# Patient Record
Sex: Female | Born: 1965 | Race: White | Hispanic: No | Smoking: Current every day smoker
Health system: Southern US, Community
[De-identification: ages and names within clinical notes are randomized; demographics above are authoritative.]

## PROBLEM LIST (undated history)

## (undated) DIAGNOSIS — Z87442 Personal history of urinary calculi: Secondary | ICD-10-CM

## (undated) DIAGNOSIS — M545 Low back pain, unspecified: Secondary | ICD-10-CM

## (undated) DIAGNOSIS — A630 Anogenital (venereal) warts: Secondary | ICD-10-CM

## (undated) DIAGNOSIS — N901 Moderate vulvar dysplasia: Secondary | ICD-10-CM

## (undated) DIAGNOSIS — E669 Obesity, unspecified: Secondary | ICD-10-CM

## (undated) DIAGNOSIS — M1811 Unilateral primary osteoarthritis of first carpometacarpal joint, right hand: Secondary | ICD-10-CM

## (undated) DIAGNOSIS — G43909 Migraine, unspecified, not intractable, without status migrainosus: Secondary | ICD-10-CM

## (undated) DIAGNOSIS — F419 Anxiety disorder, unspecified: Secondary | ICD-10-CM

## (undated) DIAGNOSIS — N879 Dysplasia of cervix uteri, unspecified: Secondary | ICD-10-CM

## (undated) DIAGNOSIS — M722 Plantar fascial fibromatosis: Secondary | ICD-10-CM

## (undated) DIAGNOSIS — Z9889 Other specified postprocedural states: Secondary | ICD-10-CM

## (undated) DIAGNOSIS — J449 Chronic obstructive pulmonary disease, unspecified: Secondary | ICD-10-CM

## (undated) DIAGNOSIS — F329 Major depressive disorder, single episode, unspecified: Secondary | ICD-10-CM

## (undated) DIAGNOSIS — O24419 Gestational diabetes mellitus in pregnancy, unspecified control: Secondary | ICD-10-CM

## (undated) DIAGNOSIS — R112 Nausea with vomiting, unspecified: Secondary | ICD-10-CM

## (undated) DIAGNOSIS — N6001 Solitary cyst of right breast: Secondary | ICD-10-CM

## (undated) DIAGNOSIS — G5603 Carpal tunnel syndrome, bilateral upper limbs: Secondary | ICD-10-CM

## (undated) DIAGNOSIS — K219 Gastro-esophageal reflux disease without esophagitis: Secondary | ICD-10-CM

## (undated) DIAGNOSIS — B009 Herpesviral infection, unspecified: Secondary | ICD-10-CM

## (undated) DIAGNOSIS — F1920 Other psychoactive substance dependence, uncomplicated: Secondary | ICD-10-CM

## (undated) DIAGNOSIS — I10 Essential (primary) hypertension: Secondary | ICD-10-CM

## (undated) HISTORY — PX: MYRINGOTOMY: SUR874

## (undated) HISTORY — PX: WISDOM TOOTH EXTRACTION: SHX21

## (undated) HISTORY — DX: Dysplasia of cervix uteri, unspecified: N87.9

## (undated) HISTORY — PX: COLONOSCOPY: SHX174

## (undated) HISTORY — PX: LASER ABLATION OF THE CERVIX: SHX1949

## (undated) HISTORY — PX: ABDOMINAL HYSTERECTOMY: SHX81

## (undated) HISTORY — PX: CARPAL TUNNEL RELEASE: SHX101

## (undated) HISTORY — PX: TUBAL LIGATION: SHX77

## (undated) HISTORY — PX: TONSILLECTOMY AND ADENOIDECTOMY: SUR1326

---

## 1994-01-14 HISTORY — PX: CERVICAL CONE BIOPSY: SUR198

## 1997-07-11 ENCOUNTER — Other Ambulatory Visit: Admission: RE | Admit: 1997-07-11 | Discharge: 1997-07-11 | Payer: Self-pay | Admitting: Family Medicine

## 1997-08-08 ENCOUNTER — Other Ambulatory Visit: Admission: RE | Admit: 1997-08-08 | Discharge: 1997-08-08 | Payer: Self-pay | Admitting: Family Medicine

## 1998-10-10 ENCOUNTER — Encounter: Payer: Self-pay | Admitting: Emergency Medicine

## 1998-10-10 ENCOUNTER — Emergency Department (HOSPITAL_COMMUNITY): Admission: EM | Admit: 1998-10-10 | Discharge: 1998-10-10 | Payer: Self-pay | Admitting: Emergency Medicine

## 1998-12-21 ENCOUNTER — Other Ambulatory Visit: Admission: RE | Admit: 1998-12-21 | Discharge: 1998-12-21 | Payer: Self-pay | Admitting: Family Medicine

## 1999-12-13 ENCOUNTER — Other Ambulatory Visit: Admission: RE | Admit: 1999-12-13 | Discharge: 1999-12-13 | Payer: Self-pay | Admitting: Ophthalmology

## 2000-10-01 ENCOUNTER — Other Ambulatory Visit: Admission: RE | Admit: 2000-10-01 | Discharge: 2000-10-01 | Payer: Self-pay | Admitting: Family Medicine

## 2002-04-19 ENCOUNTER — Emergency Department (HOSPITAL_COMMUNITY): Admission: EM | Admit: 2002-04-19 | Discharge: 2002-04-19 | Payer: Self-pay | Admitting: Emergency Medicine

## 2005-12-06 ENCOUNTER — Emergency Department (HOSPITAL_COMMUNITY): Admission: EM | Admit: 2005-12-06 | Discharge: 2005-12-06 | Payer: Self-pay | Admitting: Family Medicine

## 2006-06-30 ENCOUNTER — Emergency Department (HOSPITAL_COMMUNITY): Admission: EM | Admit: 2006-06-30 | Discharge: 2006-06-30 | Payer: Self-pay | Admitting: Emergency Medicine

## 2006-07-11 ENCOUNTER — Emergency Department (HOSPITAL_COMMUNITY): Admission: EM | Admit: 2006-07-11 | Discharge: 2006-07-11 | Payer: Self-pay | Admitting: Emergency Medicine

## 2007-07-15 HISTORY — PX: TOTAL VAGINAL HYSTERECTOMY: SHX2548

## 2007-08-05 ENCOUNTER — Ambulatory Visit (HOSPITAL_COMMUNITY): Admission: RE | Admit: 2007-08-05 | Discharge: 2007-08-06 | Payer: Self-pay | Admitting: Obstetrics and Gynecology

## 2007-08-05 ENCOUNTER — Encounter (INDEPENDENT_AMBULATORY_CARE_PROVIDER_SITE_OTHER): Payer: Self-pay | Admitting: Obstetrics and Gynecology

## 2007-12-09 ENCOUNTER — Encounter: Admission: RE | Admit: 2007-12-09 | Discharge: 2007-12-09 | Payer: Self-pay | Admitting: Family Medicine

## 2007-12-18 ENCOUNTER — Encounter: Admission: RE | Admit: 2007-12-18 | Discharge: 2007-12-18 | Payer: Self-pay | Admitting: Family Medicine

## 2007-12-18 DIAGNOSIS — N6001 Solitary cyst of right breast: Secondary | ICD-10-CM

## 2007-12-18 HISTORY — DX: Solitary cyst of right breast: N60.01

## 2010-03-12 DIAGNOSIS — E669 Obesity, unspecified: Secondary | ICD-10-CM | POA: Insufficient documentation

## 2010-05-04 ENCOUNTER — Emergency Department (HOSPITAL_COMMUNITY)
Admission: EM | Admit: 2010-05-04 | Discharge: 2010-05-04 | Disposition: A | Discharge status: Home / Self Care | Payer: Medicaid Other | Attending: Emergency Medicine | Admitting: Emergency Medicine

## 2010-05-04 DIAGNOSIS — H6690 Otitis media, unspecified, unspecified ear: Secondary | ICD-10-CM | POA: Insufficient documentation

## 2010-05-04 DIAGNOSIS — K219 Gastro-esophageal reflux disease without esophagitis: Secondary | ICD-10-CM | POA: Insufficient documentation

## 2010-05-04 DIAGNOSIS — J3489 Other specified disorders of nose and nasal sinuses: Secondary | ICD-10-CM | POA: Insufficient documentation

## 2010-05-04 DIAGNOSIS — H9209 Otalgia, unspecified ear: Secondary | ICD-10-CM | POA: Insufficient documentation

## 2010-05-04 LAB — RAPID STREP SCREEN (MED CTR MEBANE ONLY): Streptococcus, Group A Screen (Direct): NEGATIVE

## 2010-05-29 NOTE — H&P (Signed)
[name], [name]                 ACCOUNT NO.:  [id number]   MEDICAL RECORD NO.:  [id number]          PATIENT TYPE:  OUT   LOCATION:  SDC                           FACILITY:  [facility]   PHYSICIAN:  Zenaida Niece, M.D.DATE OF BIRTH:  1965/03/09   DATE OF ADMISSION:  DATE OF DISCHARGE:                              HISTORY & PHYSICAL   CHIEF COMPLAINT:  Is menorrhagia and dysmenorrhea.   HISTORY OF PRESENT ILLNESS:  This is a 45 year old female gravida 4,  para 2-0-2-2 whom I saw for the first time in May of this year.  At that  time she was having regular periods every month.  She says that she is  miserable during her periods, in bed for 1-2 days with cramps and heavy  bleeding with clots.  She has a history of bacterial vaginosis and does  complain of odor after her periods and with intercourse.  She also has  occasional pain with intercourse in her lower abdomen. Physical exam  revealed a normal-sized uterus and no significant masses.  Office  hemoglobin and urine were normal.  Her Affirm was positive for bacterial  vaginosis and she was treated with oral clindamycin.  All nonsurgical  and surgical options for her symptoms were discussed and the patient  wishes to proceed with definitive surgical therapy and is being admitted  for vaginal hysterectomy.   PAST OB HISTORY:  Two vaginal deliveries at term and 2 elective  terminations.   PAST MEDICAL HISTORY:  Negative.  No venous thromboembolic disease.   SURGICAL HISTORY:  1. Bilateral tubal ligation.  2. Tonsillectomy.  3. Laser cone biopsy.   ALLERGIES:  None known.   CURRENT MEDICATIONS:  None.   GYN HISTORY:  History of CIN with laser cone.  No recent Pap smears  until the one done here in May which was normal, also with a negative  gonorrhea and chlamydia.  She does have a history of gonorrhea,  chlamydia and Trichomonas.   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  She used  to use crack cocaine.   In May she reported that she had been clean for  approximately 120 days.  She does smoke a half-pack of cigarettes a day.   REVIEW OF SYSTEMS:  She does have some urinary frequency and some stress  incontinence but these are not and issue and normal bowel movements.   FAMILY HISTORY:  Paternal grandmother with breast cancer.  Paternal  great aunt with some sort of pelvic cancer.   PHYSICAL EXAM:  Generally, this is a well-developed white female in no  acute distress.  Weight is 180 pounds.  Blood pressure was 110/74.  NECK:  Supple without lymphadenopathy or thyromegaly.  LUNGS:  Clear to auscultation.  HEART:  Regular rate and rhythm without murmur.  ABDOMEN:  Soft, nontender, nondistended without a palpable mass.  EXTREMITIES:  Have no edema and are nontender.  PELVIC EXAM:  External genitalia was normal with 2 small furuncles.  Speculum exam revealed a normal cervix.  On bimanual exam showed a small  anteverted  to midplanar nontender uterus and no adnexal masses and this  is confirmed by rectovaginal exam.   ASSESSMENT:  Is menorrhagia and dysmenorrhea that is essentially  debilitating for 1-2 days a month.  All nonsurgical and surgical options  have been discussed with the patient.  The patient wishes to proceed  with definitive surgical therapy with hysterectomy.  All surgical routes  and all risks of surgery have been discussed.  Plan is to admit the  patient on the day of surgery for a vaginal hysterectomy and cystoscopy.  She has been taking Septra for a preoperative urinary tract infection.      Zenaida Niece, M.D.  Electronically Signed     TDM/MEDQ  D:  08/04/2007  T:  08/04/2007  Job:  [id number]

## 2010-05-29 NOTE — Op Note (Signed)
[name], AXE                 ACCOUNT NO.:  [id number]   MEDICAL RECORD NO.:  [id number]          PATIENT TYPE:  OIB   LOCATION:  9309                          FACILITY:  [facility]   PHYSICIAN:  Zenaida Niece, M.D.DATE OF BIRTH:  03-22-1965   DATE OF PROCEDURE:  08/05/2007  DATE OF DISCHARGE:                               OPERATIVE REPORT   PREOPERATIVE DIAGNOSES:  Menorrhagia and dysmenorrhea.   POSTOPERATIVE DIAGNOSES:  Menorrhagia and dysmenorrhea.   PROCEDURES:  Total vaginal hysterectomy and cystoscopy.   SURGEON:  Zenaida Niece, MD   ASSISTANT:  Sherron Monday, MD   ANESTHESIA:  Spinal.   FINDINGS:  She had normal uterus and ovaries.  Bladder with cystoscopy  was normal with patent ureters.   SPECIMENS:  Uterus with cervix sent to pathology.   ESTIMATED BLOOD LOSS:  100 mL.   COMPLICATIONS:  None.   PROCEDURE IN DETAIL:  The patient was taken to the operating room and  placed in the sitting position.  Dr. Malen Gauze instilled spinal anesthesia.  She was then placed in the dorsal supine position.  Legs were then  elevated in mobile stirrups.  Perineum and vagina were then prepped and  draped in the usual sterile fashion and bladder drained with a latex-  free catheter.  A weighted spectrum was placed into the vagina and  Deaver  retractors were used anteriorly and laterally.  Cervix was  grasped with Christella Hartigan tenaculums.  The cervical vaginal mucosa was  infiltrated with a dilute solution of Pitressin and then incised  circumferentially with electrocautery.  Sharp dissection was then used  to further free the vagina from the cervix.  The anterior peritoneum was  pushed off the anterior portion of the cervix.  This was identified and  entered sharply and a Deaver retractor was used to retract the bladder  anterior.  The posterior cul-de-sac was identified and entered sharply.  A Bonnano speculum was then placed into the posterior cul-de-sac.  Uterosacral ligaments were  clamped, transected and ligated on each side  with #1 chromic.  Cardinal ligaments and uterine artery were clamped,  transected and ligated with #1 chromic.  Broad ligaments were then  clamped, transected and ligated with #1 chromic.  The posterior uterus  was then delivered through the incision and the utero-ovarian pedicles  were clamped and transected and the uterus was removed.  The utero-  ovarian pedicles were doubly ligated with #1 chromic.  Tubes and ovaries  were normal.  All pedicles were inspected.  A small amount bleeding on  the right side was controlled with electrocautery.  The uterosacral  ligaments were then plicated in the midline with 2-0 silk.  The  previously tied uterosacral pedicles were also tied in the midline.  The  vaginal cuff was then closed in a vertical fashion with running, locking  2-0 Vicryl with adequate closure and adequate hemostasis.   Attention was turned to cystoscopy.  The patient had been given indigo  carmine IV.  A 70-degree cystoscope was inserted and good visualization  was achieved.  The bladder appeared normal.  Indigo carmine was seen to  flow freely from each ureteral orifice.  The cystoscope was removed and  a latex-free Foley catheter was placed.  The patient was then taken down  from stirrups.  She was taken to the recovery room in stable condition  after tolerating the procedure well.  Counts were correct x2, she  received Ancef 1 gram IV prior to the procedure and had PAS hose on  throughout the procedure.      Zenaida Niece, M.D.  Electronically Signed     TDM/MEDQ  D:  08/05/2007  T:  08/06/2007  Job:  [id number]

## 2010-10-12 LAB — CBC
HCT: 36.8
HCT: 41.8
Hemoglobin: 12.4
Hemoglobin: 14.3
MCHC: 33.6
MCV: 95
RBC: 3.87
RBC: 4.51
RDW: 13.4

## 2010-11-19 DIAGNOSIS — J42 Unspecified chronic bronchitis: Secondary | ICD-10-CM | POA: Insufficient documentation

## 2011-01-23 ENCOUNTER — Ambulatory Visit (HOSPITAL_COMMUNITY): Payer: Medicaid Other | Admitting: Psychiatry

## 2011-03-22 ENCOUNTER — Ambulatory Visit (HOSPITAL_COMMUNITY): Payer: Medicaid Other | Admitting: Psychiatry

## 2017-06-24 DIAGNOSIS — M1811 Unilateral primary osteoarthritis of first carpometacarpal joint, right hand: Secondary | ICD-10-CM | POA: Insufficient documentation

## 2017-06-24 DIAGNOSIS — G5603 Carpal tunnel syndrome, bilateral upper limbs: Secondary | ICD-10-CM | POA: Insufficient documentation

## 2017-09-11 NOTE — Progress Notes (Signed)
[place] at [facility] Note: New Patient FIRST VISIT   Consult was requested by Dr. Thornell Sartorius for vulvar dysplasia incompletely resected   Chief Complaint  Patient presents with  . VIN II (vulvar intraepithelial neoplasia II)    ONCOLOGIC SUMMARY 1. N/A   HPI: [name]  is a very nice 52 y.o.  P2  The patient was seen for a vulvar lesion by Derry Lory, FNP in Dr. Roe Rutherford office. There was a lesion seen that was excised by Dr. Melba Coon. 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 Melba Coon referred the patient to see if we recommend reexcision of the surgical site.  The patient had a hard time at work after the procedure, missing almost 2 weeks and then getting reprimanded from administration about her time taken. She voices concern about having to face another procedure with these constraints.  Notable is that Dr. Melba Coon performed vaginal hysterectomy here at [facility] 07/2007 for menorrhagia/dysmenorrhea. Pathology was benign, no mention of dysplasia in cervix.   Imported EPIC Oncologic History:   No history exists.    ECOG PERFORMANCE STATUS: 1 - Symptomatic but completely ambulatory  Measurement of disease: N/A .   Radiology: . Nothing relevant to referral  Outpatient Encounter Medications as of 09/12/2017  Medication Sig  . ibuprofen (ADVIL,MOTRIN) 200 MG tablet Take 200 mg by mouth as needed.   No facility-administered encounter medications on file as of 09/12/2017.    No Known Allergies  Past Medical History:  Diagnosis Date  . Cervical dysplasia    Past Surgical History:  Procedure Laterality Date  . CERVICAL CONE BIOPSY  1996   Laser  . TONSILLECTOMY AND ADENOIDECTOMY    . TOTAL VAGINAL HYSTERECTOMY  2009   TVH-menorrhagia, dysmenorrhea  . TUBAL LIGATION     Tubal Ligation/Steriization        Past Gynecological History:   GYNECOLOGIC HISTORY:  . No LMP recorded. 52 yo with vag hyst . Menarche: 52  years old . P 2 . ContraceptiveOCP in past . HRT none  . Last Pap 05/2007 negative on record. N/A since patient had hysterectomy for non-dysplasia diagnosis Family Hx:  Family History  Problem Relation Age of Onset  . Heart failure Mother   . Diabetes Mother   . Breast cancer Paternal Grandmother   . Tuberculosis Paternal Grandmother   . COPD Maternal Grandmother   . Emphysema Maternal Grandmother   . Lung cancer Maternal Grandfather   . Heart failure Paternal Grandfather    Social Hx:  Marland Kitchen Tobacco use: current . Alcohol use: none . Illicit Drug use: none . Illicit IV Drug use: none    Review of Systems: Review of Systems  Skin: Positive for wound.  All other systems reviewed and are negative. + early satiety  Vitals: There were no vitals taken for this visit. Vitals:   09/12/17 1400  Weight: 176 lb 8 oz (80.1 kg)  Height: 5\' 4"  (1.626 m)    Vitals:   09/12/17 1400  BP: 129/86  Pulse: 70  Resp: 20  Temp: 98.7 F (37.1 C)  SpO2: 98%   Body mass index is 30.3 kg/m.   Physical Exam: General :  Well developed, 52 y.o., female in no apparent distress HEENT:  Normocephalic/atraumatic, symmetric, EOMI, eyelids normal Neck:   Supple, no masses.  Lymphatics:  No cervical/ submandibular/ supraclavicular/ infraclavicular/ inguinal adenopathy Respiratory:  Respirations unlabored, no use of accessory muscles CV:   Deferred Breast:  Deferred Musculoskeletal: No CVA tenderness, normal muscle strength. Abdomen:  Soft, non-tender and nondistended. No evidence of hernia. No masses. Extremities:  No lymphedema, no erythema, non-tender. Skin:   Normal inspection Neuro/Psych:  No focal motor deficit, no abnormal mental status. Normal gait. Normal affect. Alert and oriented to person, place, and time  Genito Urinary: Vulva: 3-4:00 (left vulva) healing from office excision. There is visible AWL still on the superior/medial border. I did not use vinegar due to the healing tissue and  concern for pain Bladder/urethra: Urethral meatus normal in size and location. 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 Data reviewed ? There are no radiology reports relevant to her referral to review ? We did review the pathology report relevant to why she is being seen today (that being VIN 1-2) ? I reviewed her referring doctor's office notes and I have summarized in the HPI ? History was obtained from the patient and partly from the chart 2. VIN 1-2 ? Typically we can observe if surgical margins are grossly cleared, or if simply VIN1. ? However there is a lesion remaining at the border of the excision site and the pathology discusses VIN2 ? I recommend therefore that we move to reexcise the area 3. We discussed partial vulvectomy R/B/A and being out on FMLA about 3 weeks with reassessment at 2 weeks to see if that plan will work 4. We discussed smoking cessation today and I offered ideas for quitting. o I specifically explained that HPV thrives in a nicotine laden environment and encouraged her to stop or she risks dysplasia in the vagina and the vulva given this is likely HPV related.  Face to face time with patient was 45 minutes. Over 50% of this time was spent on counseling and coordination of care.  Isabel Caprice, MD  09/12/2017, 6:09 PM    Cc: Janyth Contes, MD (Referring Ob/Gyn) N/A patient denies (PCP)

## 2017-09-11 NOTE — H&P (View-Only) (Signed)
[place] at [facility] Note: New Patient FIRST VISIT   Consult was requested by Dr. Thornell Sartorius for vulvar dysplasia incompletely resected   Chief Complaint  Patient presents with  . VIN II (vulvar intraepithelial neoplasia II)    ONCOLOGIC SUMMARY 1. N/A   HPI: [name]  is a very nice 52 y.o.  P2  The patient was seen for a vulvar lesion by Derry Lory, FNP in Dr. Roe Rutherford office. There was a lesion seen that was excised by Dr. Melba Coon. 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 Melba Coon referred the patient to see if we recommend reexcision of the surgical site.  The patient had a hard time at work after the procedure, missing almost 2 weeks and then getting reprimanded from administration about her time taken. She voices concern about having to face another procedure with these constraints.  Notable is that Dr. Melba Coon performed vaginal hysterectomy here at [facility] 07/2007 for menorrhagia/dysmenorrhea. Pathology was benign, no mention of dysplasia in cervix.   Imported EPIC Oncologic History:   No history exists.    ECOG PERFORMANCE STATUS: 1 - Symptomatic but completely ambulatory  Measurement of disease: N/A .   Radiology: . Nothing relevant to referral  Outpatient Encounter Medications as of 09/12/2017  Medication Sig  . ibuprofen (ADVIL,MOTRIN) 200 MG tablet Take 200 mg by mouth as needed.   No facility-administered encounter medications on file as of 09/12/2017.    No Known Allergies  Past Medical History:  Diagnosis Date  . Cervical dysplasia    Past Surgical History:  Procedure Laterality Date  . CERVICAL CONE BIOPSY  1996   Laser  . TONSILLECTOMY AND ADENOIDECTOMY    . TOTAL VAGINAL HYSTERECTOMY  2009   TVH-menorrhagia, dysmenorrhea  . TUBAL LIGATION     Tubal Ligation/Steriization        Past Gynecological History:   GYNECOLOGIC HISTORY:  . No LMP recorded. 52 yo with vag hyst . Menarche: 52  years old . P 2 . ContraceptiveOCP in past . HRT none  . Last Pap 05/2007 negative on record. N/A since patient had hysterectomy for non-dysplasia diagnosis Family Hx:  Family History  Problem Relation Age of Onset  . Heart failure Mother   . Diabetes Mother   . Breast cancer Paternal Grandmother   . Tuberculosis Paternal Grandmother   . COPD Maternal Grandmother   . Emphysema Maternal Grandmother   . Lung cancer Maternal Grandfather   . Heart failure Paternal Grandfather    Social Hx:  Marland Kitchen Tobacco use: current . Alcohol use: none . Illicit Drug use: none . Illicit IV Drug use: none    Review of Systems: Review of Systems  Skin: Positive for wound.  All other systems reviewed and are negative. + early satiety  Vitals: There were no vitals taken for this visit. Vitals:   09/12/17 1400  Weight: 176 lb 8 oz (80.1 kg)  Height: 5\' 4"  (1.626 m)    Vitals:   09/12/17 1400  BP: 129/86  Pulse: 70  Resp: 20  Temp: 98.7 F (37.1 C)  SpO2: 98%   Body mass index is 30.3 kg/m.   Physical Exam: General :  Well developed, 52 y.o., female in no apparent distress HEENT:  Normocephalic/atraumatic, symmetric, EOMI, eyelids normal Neck:   Supple, no masses.  Lymphatics:  No cervical/ submandibular/ supraclavicular/ infraclavicular/ inguinal adenopathy Respiratory:  Respirations unlabored, no use of accessory muscles CV:   Deferred Breast:  Deferred Musculoskeletal: No CVA tenderness, normal muscle strength. Abdomen:  Soft, non-tender and nondistended. No evidence of hernia. No masses. Extremities:  No lymphedema, no erythema, non-tender. Skin:   Normal inspection Neuro/Psych:  No focal motor deficit, no abnormal mental status. Normal gait. Normal affect. Alert and oriented to person, place, and time  Genito Urinary: Vulva: 3-4:00 (left vulva) healing from office excision. There is visible AWL still on the superior/medial border. I did not use vinegar due to the healing tissue and  concern for pain Bladder/urethra: Urethral meatus normal in size and location. 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 Data reviewed ? There are no radiology reports relevant to her referral to review ? We did review the pathology report relevant to why she is being seen today (that being VIN 1-2) ? I reviewed her referring doctor's office notes and I have summarized in the HPI ? History was obtained from the patient and partly from the chart 2. 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 Typically we can observe if surgical margins are grossly cleared, or if simply VIN1. ? However there is a lesion remaining at the border of the excision site and the pathology discusses VIN2 ? I recommend therefore that we move to reexcise the area 3. We discussed partial vulvectomy R/B/A and being out on FMLA about 3 weeks with reassessment at 2 weeks to see if that plan will work 4. We discussed smoking cessation today and I offered ideas for quitting. o I specifically explained that HPV thrives in a nicotine laden environment and encouraged her to stop or she risks dysplasia in the vagina and the vulva given this is likely HPV related.  Face to face time with patient was 45 minutes. Over 50% of this time was spent on counseling and coordination of care.  Isabel Caprice, MD  09/12/2017, 6:09 PM    Cc: Janyth Contes, MD (Referring Ob/Gyn) N/A patient denies (PCP)

## 2017-09-12 ENCOUNTER — Encounter: Payer: Self-pay | Admitting: Gynecologic Oncology

## 2017-09-12 ENCOUNTER — Encounter: Payer: Self-pay | Admitting: *Deleted

## 2017-09-12 ENCOUNTER — Inpatient Hospital Stay: Payer: BLUE CROSS/BLUE SHIELD | Attending: Obstetrics | Admitting: Obstetrics

## 2017-09-12 VITALS — BP 129/86 | HR 70 | Temp 98.7°F | Resp 20 | Ht 64.0 in | Wt 176.5 lb

## 2017-09-12 DIAGNOSIS — F17210 Nicotine dependence, cigarettes, uncomplicated: Secondary | ICD-10-CM

## 2017-09-12 DIAGNOSIS — N901 Moderate vulvar dysplasia: Secondary | ICD-10-CM | POA: Insufficient documentation

## 2017-09-12 DIAGNOSIS — Z90710 Acquired absence of both cervix and uterus: Secondary | ICD-10-CM

## 2017-09-12 NOTE — Patient Instructions (Signed)
Plan to have a partial vulvectomy on September 25, 2017 with Dr. Precious Haws at the [facility].  You will receive a phone call from the pre-surgical RN to discuss instructions.  Please call for any questions or concerns.   Vulvectomy Vulvectomy is a surgical procedure to remove all or part of the outer female genital organs (vulva). The vulva includes the outer and inner lips of the vagina and the clitoris. You may need this surgery if you have a cancerous growth in your vulva. There are two types of vulvectomy:  A simple vulvectomy. This is the removal of the entire vulva.  A radical vulvectomy. A radical vulvectomy can be partial or complete. ? A partial radical vulvectomy is when part of the vulva and surrounding deep tissue is removed. ? A complete radical vulvectomy is when the vulva, clitoris, and surrounding deep tissue is removed.  During a radical vulvectomy, some lymph nodes near the vulva may also be removed. Tell a health care provider about:  Any allergies you have.  All medicines you are taking, including vitamins, herbs, eye drops, creams, and over-the-counter medicines.  Any problems you or family members have had with anesthetic medicines.  Any blood disorders you have.  Any surgeries you have had.  Any medical conditions you have.  Whether you are pregnant or may be pregnant. What are the risks? Generally, this is a safe procedure. However, problems may occur, including:  Infection.  Bleeding.  Allergic reactions to medicines.  Damage to other structures or organs.  Urinary tract infections.  Lymphedema. This is when your legs swell after the removal of lymph nodes from your groin area.  Pain or decreased sexual pleasure when having sex.  Long-term vaginal swelling, tightness, numbness, or pain.  A blood clot that may travel to the lung (pulmonary embolism).  What happens before the procedure?  Follow instructions from  your health care provider about eating or drinking restrictions.  Ask your health care provider about: ? Changing or stopping your regular medicines. This is especially important if you are taking diabetes medicines or blood thinners. ? Taking medicines such as aspirin and ibuprofen. These medicines can thin your blood. Do not take these medicines before your procedure if your health care provider instructs you not to.  Ask your health care provider how your surgical site will be marked or identified.  You may be given antibiotic medicine to help prevent infection.  Plan to have someone take you home after the procedure.  If you will be going home right after the procedure, plan to have someone with you for 24 hours. What happens during the procedure?  To reduce your risk of infection: ? Your health care team will wash or sanitize their hands. ? Your skin will be washed with soap.  An IV tube will be inserted into one of your veins.  You will be given one or more of the following: ? A medicine to help you relax (sedative). ? A medicine to make you fall asleep (general anesthetic). ? A medicine that is injected into your spine to numb the area below and slightly above the injection site (spinal anesthetic).  A tube (catheter) may be inserted through the outer opening of your bladder (urethra) to drain urine during and after surgery.  Depending on the type of vulvectomy you are having, your surgeon will make an incision and remove the affected area. This may include: ? Removing the entire vulva. ? Removing part of  the vulva, surrounding deep tissue, and lymph nodes. ? Removing the vulva, clitoris, surrounding deep tissue, and lymph nodes.  If your lymph nodes are removed, a drain may be placed in the area to help avoid fluid buildup.  Your incisions will be closed and covered with a bandage (dressing). The procedure may vary among health care providers and hospitals. What happens  after the procedure?  Your blood pressure, heart rate, breathing rate, and blood oxygen level will be monitored often until the medicines you were given have worn off.  You will get medicine for pain as needed.  You may get medicine to prevent constipation.  You may be on a liquid diet at first, and then switch to a regular diet.  When you are taking fluids well, your IV will be removed.  If your catheter was left in place after surgery, it will be removed when your health care provider approves.  You will be asked to breathe deeply and to get out of bed and walk as soon as you can.  You may have to wear compression stockings. These stockings help to prevent blood clots and reduce swelling in your legs.  Do not drive for 24 hours if you received a sedative. This information is not intended to replace advice given to you by your health care provider. Make sure you discuss any questions you have with your health care provider. Document Released: 01/27/2015 Document Revised: 06/08/2015 Document Reviewed: 12/26/2014 Elsevier Interactive Patient Education  [employer].

## 2017-09-18 ENCOUNTER — Telehealth: Payer: Self-pay

## 2017-09-18 ENCOUNTER — Encounter (HOSPITAL_BASED_OUTPATIENT_CLINIC_OR_DEPARTMENT_OTHER): Payer: Self-pay

## 2017-09-18 NOTE — Telephone Encounter (Signed)
Received a call from Maudie Mercury at [facility] for Dr Melba Coon and Lesia Hausen NP regarding referral here and recent office notes per Dr Gerarda Fraction.  Per request of Eve NP / Dr Melba Coon- faxed those notes to their fax [phone]. No other needs per office at this time.

## 2017-09-19 ENCOUNTER — Other Ambulatory Visit: Payer: Self-pay

## 2017-09-19 ENCOUNTER — Encounter (HOSPITAL_BASED_OUTPATIENT_CLINIC_OR_DEPARTMENT_OTHER): Payer: Self-pay

## 2017-09-19 NOTE — Progress Notes (Signed)
Spoke with:  Raquel Sarna NPO:  After Midnight, no gum, candy, or mints   Arrival time:0530AM Labs:  N/A AM medications: None Pre op orders: Yes Ride home:  Santiago Glad (mom) [phone]

## 2017-09-25 ENCOUNTER — Ambulatory Visit (HOSPITAL_BASED_OUTPATIENT_CLINIC_OR_DEPARTMENT_OTHER): Payer: BLUE CROSS/BLUE SHIELD | Admitting: Anesthesiology

## 2017-09-25 ENCOUNTER — Encounter (HOSPITAL_BASED_OUTPATIENT_CLINIC_OR_DEPARTMENT_OTHER): Payer: Self-pay | Admitting: *Deleted

## 2017-09-25 ENCOUNTER — Ambulatory Visit (HOSPITAL_BASED_OUTPATIENT_CLINIC_OR_DEPARTMENT_OTHER)
Admission: RE | Admit: 2017-09-25 | Discharge: 2017-09-25 | Disposition: A | Discharge status: Home / Self Care | Payer: BLUE CROSS/BLUE SHIELD | Source: Ambulatory Visit | Attending: Obstetrics | Admitting: Obstetrics

## 2017-09-25 ENCOUNTER — Encounter (HOSPITAL_BASED_OUTPATIENT_CLINIC_OR_DEPARTMENT_OTHER)
Admission: RE | Disposition: A | Discharge status: Home / Self Care | Payer: Self-pay | Source: Ambulatory Visit | Attending: Obstetrics

## 2017-09-25 DIAGNOSIS — N901 Moderate vulvar dysplasia: Secondary | ICD-10-CM | POA: Insufficient documentation

## 2017-09-25 DIAGNOSIS — J449 Chronic obstructive pulmonary disease, unspecified: Secondary | ICD-10-CM | POA: Diagnosis not present

## 2017-09-25 DIAGNOSIS — N904 Leukoplakia of vulva: Secondary | ICD-10-CM | POA: Diagnosis not present

## 2017-09-25 DIAGNOSIS — K219 Gastro-esophageal reflux disease without esophagitis: Secondary | ICD-10-CM | POA: Diagnosis not present

## 2017-09-25 DIAGNOSIS — F17200 Nicotine dependence, unspecified, uncomplicated: Secondary | ICD-10-CM | POA: Insufficient documentation

## 2017-09-25 DIAGNOSIS — Z90710 Acquired absence of both cervix and uterus: Secondary | ICD-10-CM | POA: Insufficient documentation

## 2017-09-25 DIAGNOSIS — N903 Dysplasia of vulva, unspecified: Secondary | ICD-10-CM

## 2017-09-25 HISTORY — DX: Herpesviral infection, unspecified: B00.9

## 2017-09-25 HISTORY — DX: Carpal tunnel syndrome, bilateral upper limbs: G56.03

## 2017-09-25 HISTORY — DX: Solitary cyst of right breast: N60.01

## 2017-09-25 HISTORY — DX: Gastro-esophageal reflux disease without esophagitis: K21.9

## 2017-09-25 HISTORY — DX: Low back pain: M54.5

## 2017-09-25 HISTORY — PX: VULVECTOMY: SHX1086

## 2017-09-25 HISTORY — DX: Chronic obstructive pulmonary disease, unspecified: J44.9

## 2017-09-25 HISTORY — DX: Low back pain, unspecified: M54.50

## 2017-09-25 HISTORY — DX: Gestational diabetes mellitus in pregnancy, unspecified control: O24.419

## 2017-09-25 HISTORY — DX: Anogenital (venereal) warts: A63.0

## 2017-09-25 HISTORY — DX: Other psychoactive substance dependence, uncomplicated: F19.20

## 2017-09-25 HISTORY — DX: Anxiety disorder, unspecified: F41.9

## 2017-09-25 HISTORY — DX: Obesity, unspecified: E66.9

## 2017-09-25 HISTORY — DX: Migraine, unspecified, not intractable, without status migrainosus: G43.909

## 2017-09-25 HISTORY — DX: Moderate vulvar dysplasia: N90.1

## 2017-09-25 HISTORY — DX: Unilateral primary osteoarthritis of first carpometacarpal joint, right hand: M18.11

## 2017-09-25 HISTORY — DX: Plantar fascial fibromatosis: M72.2

## 2017-09-25 HISTORY — DX: Major depressive disorder, single episode, unspecified: F32.9

## 2017-09-25 SURGERY — VULVECTOMY
Anesthesia: General | Site: Vagina

## 2017-09-25 MED ORDER — FENTANYL CITRATE (PF) 100 MCG/2ML IJ SOLN
INTRAMUSCULAR | Status: AC
  Filled 2017-09-25: qty 2

## 2017-09-25 MED ORDER — MIDAZOLAM HCL 2 MG/2ML IJ SOLN
INTRAMUSCULAR | Status: AC
  Filled 2017-09-25: qty 2

## 2017-09-25 MED ORDER — DEXAMETHASONE SODIUM PHOSPHATE 10 MG/ML IJ SOLN
INTRAMUSCULAR | Status: AC
  Filled 2017-09-25: qty 1

## 2017-09-25 MED ORDER — KETOROLAC TROMETHAMINE 30 MG/ML IJ SOLN
30.0000 mg | Freq: Once | INTRAMUSCULAR | Status: DC | PRN
  Filled 2017-09-25: qty 1

## 2017-09-25 MED ORDER — LIDOCAINE 2% (20 MG/ML) 5 ML SYRINGE
INTRAMUSCULAR | Status: AC
  Filled 2017-09-25: qty 5

## 2017-09-25 MED ORDER — ONDANSETRON HCL 4 MG/2ML IJ SOLN
4.0000 mg | Freq: Once | INTRAMUSCULAR | Status: DC | PRN
  Filled 2017-09-25: qty 2

## 2017-09-25 MED ORDER — HYDROMORPHONE HCL 1 MG/ML IJ SOLN
0.2500 mg | INTRAMUSCULAR | Status: DC | PRN
  Filled 2017-09-25: qty 0.5

## 2017-09-25 MED ORDER — LIDOCAINE 2% (20 MG/ML) 5 ML SYRINGE
INTRAMUSCULAR | Status: DC | PRN
  Administered 2017-09-25: 60 mg via INTRAVENOUS

## 2017-09-25 MED ORDER — PROPOFOL 10 MG/ML IV BOLUS
INTRAVENOUS | Status: DC | PRN
  Administered 2017-09-25 (×2): 50 mg via INTRAVENOUS
  Administered 2017-09-25: 200 mg via INTRAVENOUS

## 2017-09-25 MED ORDER — CEFAZOLIN SODIUM-DEXTROSE 2-4 GM/100ML-% IV SOLN
2.0000 g | INTRAVENOUS | Status: AC
  Administered 2017-09-25: 2 g via INTRAVENOUS
  Filled 2017-09-25: qty 100

## 2017-09-25 MED ORDER — FENTANYL CITRATE (PF) 100 MCG/2ML IJ SOLN
25.0000 ug | INTRAMUSCULAR | Status: DC | PRN
  Filled 2017-09-25: qty 1

## 2017-09-25 MED ORDER — LIDOCAINE-EPINEPHRINE 1 %-1:100000 IJ SOLN
INTRAMUSCULAR | Status: DC | PRN
  Administered 2017-09-25: 4 mL

## 2017-09-25 MED ORDER — ACETAMINOPHEN 160 MG/5ML PO SOLN
325.0000 mg | ORAL | Status: DC | PRN
  Filled 2017-09-25: qty 20.3

## 2017-09-25 MED ORDER — ACETIC ACID 5 % SOLN
Status: DC | PRN
  Administered 2017-09-25: 1 via TOPICAL

## 2017-09-25 MED ORDER — KETOROLAC TROMETHAMINE 30 MG/ML IJ SOLN
INTRAMUSCULAR | Status: DC | PRN
  Administered 2017-09-25: 30 mg via INTRAVENOUS

## 2017-09-25 MED ORDER — FENTANYL CITRATE (PF) 100 MCG/2ML IJ SOLN
INTRAMUSCULAR | Status: DC | PRN
  Administered 2017-09-25 (×2): 50 ug via INTRAVENOUS

## 2017-09-25 MED ORDER — ONDANSETRON HCL 4 MG/2ML IJ SOLN
INTRAMUSCULAR | Status: DC | PRN
  Administered 2017-09-25: 4 mg via INTRAVENOUS

## 2017-09-25 MED ORDER — ONDANSETRON HCL 4 MG/2ML IJ SOLN
INTRAMUSCULAR | Status: AC
  Filled 2017-09-25: qty 2

## 2017-09-25 MED ORDER — KETOROLAC TROMETHAMINE 30 MG/ML IJ SOLN
INTRAMUSCULAR | Status: AC
  Filled 2017-09-25: qty 1

## 2017-09-25 MED ORDER — DEXAMETHASONE SODIUM PHOSPHATE 10 MG/ML IJ SOLN
INTRAMUSCULAR | Status: DC | PRN
  Administered 2017-09-25: 10 mg via INTRAVENOUS

## 2017-09-25 MED ORDER — TRAMADOL HCL 50 MG PO TABS: 50.0000 mg | ORAL_TABLET | Freq: Four times a day (QID) | ORAL | 0 refills | Status: AC | PRN

## 2017-09-25 MED ORDER — PROPOFOL 10 MG/ML IV BOLUS
INTRAVENOUS | Status: AC
  Filled 2017-09-25: qty 40

## 2017-09-25 MED ORDER — ACETAMINOPHEN 325 MG PO TABS
325.0000 mg | ORAL_TABLET | ORAL | Status: DC | PRN
  Filled 2017-09-25: qty 2

## 2017-09-25 MED ORDER — ARTIFICIAL TEARS OPHTHALMIC OINT
TOPICAL_OINTMENT | OPHTHALMIC | Status: AC
  Filled 2017-09-25: qty 3.5

## 2017-09-25 MED ORDER — LACTATED RINGERS IV SOLN
INTRAVENOUS | Status: DC
  Administered 2017-09-25: 07:00:00 via INTRAVENOUS
  Filled 2017-09-25: qty 1000

## 2017-09-25 MED ORDER — PROMETHAZINE HCL 25 MG/ML IJ SOLN
6.2500 mg | INTRAMUSCULAR | Status: DC | PRN
  Filled 2017-09-25: qty 1

## 2017-09-25 MED ORDER — CEFAZOLIN SODIUM-DEXTROSE 2-4 GM/100ML-% IV SOLN
INTRAVENOUS | Status: AC
  Filled 2017-09-25: qty 100

## 2017-09-25 MED ORDER — MIDAZOLAM HCL 2 MG/2ML IJ SOLN
INTRAMUSCULAR | Status: DC | PRN
  Administered 2017-09-25: 2 mg via INTRAVENOUS

## 2017-09-25 SURGICAL SUPPLY — 26 items
BLADE CLIPPER SURG (BLADE) IMPLANT
BLADE SURG 15 STRL LF DISP TIS (BLADE) ×1 IMPLANT
BLADE SURG 15 STRL SS (BLADE) ×3
CANISTER SUCT 3000ML PPV (MISCELLANEOUS) ×3 IMPLANT
CATH ROBINSON RED A/P 16FR (CATHETERS) IMPLANT
CATH SILICONE 16FRX5CC (CATHETERS) ×3 IMPLANT
GAUZE SPONGE 4X4 16PLY XRAY LF (GAUZE/BANDAGES/DRESSINGS) ×3 IMPLANT
GLOVE BIO SURGEON STRL SZ 6.5 (GLOVE) IMPLANT
GLOVE BIO SURGEONS STRL SZ 6.5 (GLOVE)
GLOVE BIOGEL PI IND STRL 7.0 (GLOVE) ×1 IMPLANT
GLOVE BIOGEL PI IND STRL 7.5 (GLOVE) ×3 IMPLANT
GLOVE BIOGEL PI INDICATOR 7.0 (GLOVE) ×2
GLOVE BIOGEL PI INDICATOR 7.5 (GLOVE) ×6
GLOVE SURG SS PI 6.5 STRL IVOR (GLOVE) ×6 IMPLANT
GLOVE SURG SYN 6.5 ES PF (GLOVE) ×3 IMPLANT
KIT TURNOVER CYSTO (KITS) ×3 IMPLANT
NEEDLE HYPO 25X1 1.5 SAFETY (NEEDLE) ×3 IMPLANT
NS IRRIG 500ML POUR BTL (IV SOLUTION) ×3 IMPLANT
PACK VAGINAL WOMENS (CUSTOM PROCEDURE TRAY) ×3 IMPLANT
PAD OB MATERNITY 4.3X12.25 (PERSONAL CARE ITEMS) ×3 IMPLANT
SUT VIC AB 0 CT1 36 (SUTURE) ×3 IMPLANT
SUT VIC AB 3-0 SH 27 (SUTURE) ×6
SUT VIC AB 3-0 SH 27X BRD (SUTURE) ×2 IMPLANT
SYR BULB IRRIGATION 50ML (SYRINGE) ×3 IMPLANT
TOWEL OR 17X24 6PK STRL BLUE (TOWEL DISPOSABLE) ×6 IMPLANT
WATER STERILE IRR 500ML POUR (IV SOLUTION) IMPLANT

## 2017-09-25 NOTE — Discharge Instructions (Signed)
Vulvectomy, Care After  The vulva is the external female genitalia, outside and around the vagina and pubic bone. It consists of:  The skin on, and in front of, the pubic bone.  The clitoris.  The labia majora (large lips) on the outside of the vagina.  The labia minora (small lips) around the opening of the vagina.  The opening and the skin in and around the vagina. A vulvectomy is the removal of the tissue of the vulva, which sometimes includes removal of the lymph nodes and tissue in the groin areas.  These discharge instructions provide you with general information on caring for yourself after you leave the hospital. It is also important that you know the warning signs of complications, so that you can seek treatment. Please read the instructions outlined below and refer to this sheet in the next few weeks. Your caregiver may also give you specific information and medicines. If you have any questions or complications after discharge, please call your caregiver.  ACTIVITY  Rest as much as possible the first two weeks after discharge.  Arrange to have help from family or others with your daily activities when you go home.  Avoid heavy lifting (more than 5 pounds), pushing, or pulling.  If you feel tired, balance your activity with rest periods.  Follow your caregiver's instruction about climbing stairs and driving a car.  Increase activity gradually.  Do not exercise until you have permission from your caregiver. LEG AND FOOT CARE  If your doctor has removed lymph nodes from your groin area, there may be an increase in swelling of your legs and feet. You can help prevent swelling by doing the following:  Elevate your legs while sitting or lying down.  If your caregiver has ordered special stockings, wear them according to instructions.  Avoid standing in one place for long periods of time.  Call the physical therapy department if you have any questions about swelling or treatment for  swelling.  Avoid salt in your diet. It can cause fluid retention and swelling.  Do not cross your legs, especially when sitting. NUTRITION  You may resume your normal diet.  Drink 6 to 8 glasses of fluids a day.  Eat a healthy, balanced diet including portions of food from the meat (protein), milk, fruit, vegetable, and bread groups.  Your caregiver may recommend you take a multivitamin with iron. ELIMINATION  You may notice that your stream of urine is at a different angle, and may tend to spray. Using a plastic funnel may help to decrease urine spray.  If constipation occurs, drink more liquids, and add more fruits, vegetables, and bran to your diet. You may take a mild laxative, such as Milk of Magnesia, Metamucil, or a stool softener such as Colace, with permission from your caregiver. HYGIENE  You may shower and wash your hair.  Check with your caregiver about tub baths.  Do not add any bath oils or chemicals to your bath water, after you have permission to take baths.  Clean yourself well after moving your bowels.  After urinating, wipe from top to bottom.  A sitz bath will help keep your perineal area clean, reduce swelling, and provide comfort. HOME CARE INSTRUCTIONS  Take your temperature twice a day and record it, especially if you feel feverish or have chills.  Follow your caregiver's instructions about medicines, activity, and follow-up appointments after surgery.  Do not drink alcohol while taking pain medicine.  Change your dressing as advised by  your caregiver.  You may take over-the-counter medicine for pain, recommended by your caregiver.  If your pain is not relieved with medicine, call your caregiver.  Do not take aspirin because it can cause bleeding.  Do not douche or use tampons (use a nonperfumed sanitary pad).  Do not have sexual intercourse until your caregiver gives you permission. Hugging, kissing, and playful sexual activity is fine with your caregiver's  permission.  Warm sitz baths, with your caregiver's permission, are helpful to control swelling and discomfort.  Take showers instead of baths, until your caregiver gives you permission to take baths.  You may take a mild medicine for constipation, recommended by your caregiver. Bran foods and drinking a lot of fluids will help with constipation.  Make sure your family understands everything about your operation and recovery. SEEK MEDICAL CARE IF:  You notice swelling and redness around the wound area.  You notice a foul smell coming from the wound or on the surgical dressing.  You notice the wound is separating.  You have painful or bloody urination.  You develop nausea and vomiting.  You develop diarrhea.  You develop a rash.  You have a reaction or allergy from the medicine.  You feel dizzy or light-headed.  You need stronger pain medicine. SEEK IMMEDIATE MEDICAL CARE IF:  You develop a temperature of 102 F (38.9 C) or higher.  You pass out.  You develop leg or chest pain.  You develop abdominal pain.  You develop shortness of breath.  You develop bleeding from the wound area.  You see pus in the wound area. MAKE SURE YOU:  Understand these instructions.  Will watch your condition.  Will get help right away if you are not doing well or get worse. Document Released: 08/15/2003 Document Revised: 05/17/2013 Document Reviewed: 12/02/2008  [facility] Patient Information 2015 [place], [place]. This information is not intended to replace advice given to you by your health care provider. Make sure you discuss any questions you have with your health care provider.    Post Anesthesia Home Care Instructions  Activity: Get plenty of rest for the remainder of the day. A responsible individual must stay with you for 24 hours following the procedure.  For the next 24 hours, DO NOT: -Drive a car -Paediatric nurse -Drink alcoholic beverages -Take any medication unless instructed by your  physician -Make any legal decisions or sign important papers.  Meals: Start with liquid foods such as gelatin or soup. Progress to regular foods as tolerated. Avoid greasy, spicy, heavy foods. If nausea and/or vomiting occur, drink only clear liquids until the nausea and/or vomiting subsides. Call your physician if vomiting continues.  Special Instructions/Symptoms: Your throat may feel dry or sore from the anesthesia or the breathing tube placed in your throat during surgery. If this causes discomfort, gargle with warm salt water. The discomfort should disappear within 24 hours.

## 2017-09-25 NOTE — Anesthesia Procedure Notes (Addendum)
Procedure Name: LMA Insertion Date/Time: 09/25/2017 7:34 AM Performed by: Wanita Chamberlain, CRNA Pre-anesthesia Checklist: Patient identified, Emergency Drugs available, Suction available, Patient being monitored and Timeout performed Patient Re-evaluated:Patient Re-evaluated prior to induction Oxygen Delivery Method: Circle system utilized Preoxygenation: Pre-oxygenation with 100% oxygen Induction Type: IV induction Ventilation: Mask ventilation without difficulty LMA: LMA inserted LMA Size: 4.0 Number of attempts: 1 Placement Confirmation: breath sounds checked- equal and bilateral,  CO2 detector and positive ETCO2 Tube secured with: Tape Dental Injury: Teeth and Oropharynx as per pre-operative assessment  Comments: Anterior larynx/ #3 LMA may be an easier fit

## 2017-09-25 NOTE — Anesthesia Postprocedure Evaluation (Signed)
Anesthesia Post Note  Patient: [name]  Procedure(s) Performed: PARTIAL VULVECTOMY (N/A Vagina )     Patient location during evaluation: PACU Anesthesia Type: General Level of consciousness: awake and alert Pain management: pain level controlled Vital Signs Assessment: post-procedure vital signs reviewed and stable Respiratory status: spontaneous breathing, nonlabored ventilation, respiratory function stable and patient connected to nasal cannula oxygen Cardiovascular status: stable and blood pressure returned to baseline Postop Assessment: no apparent nausea or vomiting Anesthetic complications: no    Last Vitals:  Vitals:   09/25/17 0830 09/25/17 0845  BP: 106/67 115/70  Pulse: 86 73  Resp: 15 16  Temp:    SpO2: 99% 99%    Last Pain:  Vitals:   09/25/17 0845  TempSrc:   PainSc: 0-No pain                 Theo Krumholz S

## 2017-09-25 NOTE — Interval H&P Note (Signed)
History and Physical Interval Note:  09/25/2017 7:25 AM  [name]  has presented today for surgery, with the diagnosis of VULVAR DYSPLASIA  The various methods of treatment have been discussed with the patient and family. After consideration of risks, benefits and other options for treatment, the patient has consented to  Procedure(s): PARTIAL VULVECTOMY (N/A) as a surgical intervention .  The patient's history has been reviewed, patient examined, no change in status, stable for surgery.  I have reviewed the patient's chart and labs.  Questions were answered to the patient's satisfaction.     Isabel Caprice

## 2017-09-25 NOTE — Anesthesia Preprocedure Evaluation (Addendum)
Anesthesia Evaluation  Patient identified by MRN, date of birth, ID band Patient awake    Reviewed: Allergy & Precautions, NPO status , Patient's Chart, lab work & pertinent test results  Airway Mallampati: II  TM Distance: <3 FB Neck ROM: Full    Dental no notable dental hx. (+) Teeth Intact, Dental Advisory Given,    Pulmonary COPD, Current Smoker,    Pulmonary exam normal breath sounds clear to auscultation       Cardiovascular negative cardio ROS Normal cardiovascular exam Rhythm:Regular Rate:Normal     Neuro/Psych  Headaches, Anxiety Depression negative neurological ROS  negative psych ROS   GI/Hepatic Neg liver ROS, GERD  Medicated,  Endo/Other  negative endocrine ROSdiabetes  Renal/GU negative Renal ROS  negative genitourinary   Musculoskeletal negative musculoskeletal ROS (+) Arthritis ,   Abdominal   Peds negative pediatric ROS (+)  Hematology negative hematology ROS (+)   Anesthesia Other Findings Drug free from "Crack cocaine for 5 years"  Reproductive/Obstetrics negative OB ROS                        Anesthesia Physical Anesthesia Plan  ASA: II  Anesthesia Plan: General   Post-op Pain Management:    Induction: Intravenous  PONV Risk Score and Plan: 2 and Ondansetron, Dexamethasone and Treatment may vary due to age or medical condition  Airway Management Planned: LMA  Additional Equipment:   Intra-op Plan:   Post-operative Plan: Extubation in OR  Informed Consent: I have reviewed the patients History and Physical, chart, labs and discussed the procedure including the risks, benefits and alternatives for the proposed anesthesia with the patient or authorized representative who has indicated his/her understanding and acceptance.   Dental advisory given  Plan Discussed with: CRNA, Surgeon and Anesthesiologist  Anesthesia Plan Comments:        Anesthesia Quick  Evaluation

## 2017-09-25 NOTE — Op Note (Signed)
Preop Diagnosis: VIN-III  Postoperative Diagnosis: Same  Surgery: Partial simple vulvectomy  Surgeons:  Rutha Bouchard  Assistant: none  Anesthesia: LMA  Estimated blood loss: 1 ml  Complications: None   Pathology: left vulva at 3:00 with marking stitch at 3:00. Perineal biopsy at 6:00.   Operative findings: 53mm hyperpigmented lesion perineum/introitus, 3:00 left vulva with evidence of prior excision and slightly AWL lesion on the medial/superior margin of prior excision.  Procedure:  The patient was then taken to the operating room and placed in the supine position with SCD hose on. General anesthesia was then induced without difficulty. She was then placed in the dorsolithotomy position. The perineum was prepped with Betadine. The vagina was prepped with Betadine. The patient was then draped after the prep was dried.   Timeout was performed the patient, procedure, antibiotic, allergy. 5% acetic acid solution was applied to the perineum. The vulvar tissues were inspected for areas of acetowhite changes or leukoplakia.  See findings. The lesion was identified and the marking pen was used to circumscribe the area with planned appropriate surgical margins. The subcuticular tissues were infiltrated with 1% lidocaine with epinephrine. First the perineal lesion was excised with the 15 blade scalpel and rendered hemostatic with the bovie. Then the scalpel was used to make an incision through the skin on left vulva circumferentially as marked. The skin elipse was grasped and was separated from the underlying deep dermal tissues with the scalpel. After the specimen had been completely resected, it was oriented and marked at 3:00 with a 0-vicryl suture. The bovie was used to obtain hemostasis at the surgical bed. The subcutaneous tissues were irrigated and made hemostatic.   The cutaneous layer was closed with interrupted 3-0 vicryl stitches in a vertical mattress fashion to ensure a tension free  and hemostatic closure.   All instrument, suture, laparotomy, Ray-Tec, and needle counts were correct x2. The patient tolerated the procedure well and was taken recovery room in stable condition.

## 2017-09-25 NOTE — Transfer of Care (Signed)
Immediate Anesthesia Transfer of Care Note  Patient: [name]  Procedure(s) Performed: PARTIAL VULVECTOMY (N/A Vagina )  Patient Location: PACU  Anesthesia Type:General  Level of Consciousness: awake, alert , oriented and patient cooperative  Airway & Oxygen Therapy: Patient Spontanous Breathing and Patient connected to nasal cannula oxygen  Post-op Assessment: Report given to RN and Post -op Vital signs reviewed and stable  Post vital signs: Reviewed and stable  Last Vitals:  Vitals Value Taken Time  BP    Temp    Pulse    Resp    SpO2      Last Pain:  Vitals:   09/25/17 0612  TempSrc: Oral         Complications: No apparent anesthesia complications

## 2017-09-29 ENCOUNTER — Encounter (HOSPITAL_BASED_OUTPATIENT_CLINIC_OR_DEPARTMENT_OTHER): Payer: Self-pay | Admitting: Obstetrics

## 2017-10-01 ENCOUNTER — Telehealth: Payer: Self-pay

## 2017-10-01 NOTE — Telephone Encounter (Signed)
[name] had surgery on 09-25-17. She states that a stitch or two have busted open and there is red raw area below. She notices a foul odor. Afebrile. No purulent drainage noted. She thinks she has done too much the first few days after surgery since she felt so good. Reviewed above with Angela John, NP. Told [name] that [name] stated that this can happen with the partial simple vulvectomy. [name] can see her today, tomorrow or with [name] on Friday. Pt does not feel the incision is infected.  She will come and see [name] on Friday 10-03-17 at 1100.  She will call to be seen sooner if fever or  symptoms get worse before Friday.

## 2017-10-02 NOTE — Progress Notes (Signed)
S: s/p partial vulvectomy 09/25/17 for wound check Worried about odor and a red area.   O: Vitals:   10/03/17 1135  BP: 140/83  Pulse: 67  Resp: 20  Temp: 99 F (37.2 C)  SpO2: 99%    Genito Urinary: Vulva: Excision site CDI with exception of superior border with slight separation. No exudate or signs of infection  A/P RTC one month for wound check Pathology and operative reports given today. 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

## 2017-10-03 ENCOUNTER — Inpatient Hospital Stay: Payer: BLUE CROSS/BLUE SHIELD | Attending: Obstetrics | Admitting: Obstetrics

## 2017-10-03 ENCOUNTER — Encounter: Payer: Self-pay | Admitting: Obstetrics

## 2017-10-03 VITALS — BP 140/83 | HR 67 | Temp 99.0°F | Resp 20 | Ht 64.0 in | Wt 184.0 lb

## 2017-10-03 DIAGNOSIS — N904 Leukoplakia of vulva: Secondary | ICD-10-CM | POA: Insufficient documentation

## 2017-10-03 DIAGNOSIS — N901 Moderate vulvar dysplasia: Secondary | ICD-10-CM | POA: Insufficient documentation

## 2017-10-03 DIAGNOSIS — Z9889 Other specified postprocedural states: Secondary | ICD-10-CM | POA: Insufficient documentation

## 2017-10-03 NOTE — Patient Instructions (Signed)
1. Return in one month for a pelvic

## 2017-10-06 ENCOUNTER — Inpatient Hospital Stay: Payer: BLUE CROSS/BLUE SHIELD | Admitting: Obstetrics

## 2017-10-13 ENCOUNTER — Telehealth: Payer: Self-pay

## 2017-10-13 NOTE — Telephone Encounter (Signed)
Returned pt's call regarding her stitches feel "rock-hard, looks like the skin is pushing out of stitches, red, raw toward back of incision, like it's coming apart slightly."  Reports there is no pus, denies fever but there is an odor. She has been using sitz bath 3-4 times day and peri-bottle.   Pt reports she would like to be seen tomorrow to make sure it is ok.  Appt made for 10:30 am with Lenna Sciara NP- made [facility] NP aware.

## 2017-10-14 ENCOUNTER — Inpatient Hospital Stay: Payer: BLUE CROSS/BLUE SHIELD | Attending: Obstetrics | Admitting: Gynecologic Oncology

## 2017-10-14 ENCOUNTER — Encounter: Payer: Self-pay | Admitting: Gynecologic Oncology

## 2017-10-14 VITALS — BP 117/70 | HR 78 | Temp 99.0°F | Resp 20 | Ht 64.0 in | Wt 181.1 lb

## 2017-10-14 DIAGNOSIS — Z79890 Hormone replacement therapy (postmenopausal): Secondary | ICD-10-CM

## 2017-10-14 DIAGNOSIS — Z9889 Other specified postprocedural states: Secondary | ICD-10-CM | POA: Insufficient documentation

## 2017-10-14 DIAGNOSIS — N901 Moderate vulvar dysplasia: Secondary | ICD-10-CM

## 2017-10-14 NOTE — Progress Notes (Signed)
Follow Up Note: Gyn-Onc  [name] 52 y.o. female  CC:  Chief Complaint  Patient presents with  . VIN II (vulvar intraepithelial neoplasia II)   Assessment/Plan: 52 year old female s/p partial vulvectomy for vulvar dysplasia on 09/25/17.  No evidence of incisional cellulitis or infection.  Discussed incisional care.  Donut given to the patient to use when sitting to see if this eases the pressure and pain around the incision. Recommend patient returning to work on Monday, October 20, 2017 to allow for additional healing time prior to beginning long days with sitting or standing for extended periods of time.  She is to call for any new symptoms or worsening of symptoms. She is advised to follow up as scheduled or sooner if needed.   HPI: [name] is a 52 year old female initially seen in consultation at the request of Dr. Melba Coon for vulvar dysplasia. She was initially seen for a vulvar lesion by Derry Lory, FNP at Dr. Roe Rutherford office.  The lesion was excised by Dr. 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  Dr. Melba Coon referred the patient to GYN Oncology to see if reexcision of the surgical site was necessary.  After the excision, she had moderate discomfort and missed time from work.   GYN history does include a vaginal hysterectomy here at [facility] 07/2007 for menorrhagia/dysmenorrhea. Pathology was benign, no mention of dysplasia in cervix.   On 09/25/17, she underwent a partial vulvectomy with Dr. Gerarda Fraction. Final path resulted  1. Perineum, biopsy, 6 o'clock - HYPERKERATOSIS WITH SLIGHT CHRONIC INFLAMMATION. - NO DYSPLASIA OR MALIGNANCY. 2. Vulva, excision, tag at 3 o'clock - FOCAL HIGH GRADE VULVAR INTRAEPITHELIAL NEOPLASIA, VIN-II. - FIBROSIS AND INFLAMMATION CONSISTENT WITH PREVIOUS BIOPSY SCAR. - MARGINS NOT INVOLVED. - NO EVIDENCE OF INVASIVE CARCINOMA. 3. Vulva, excision, inferior margin - HYPERKERATOSIS WITH SLIGHT CHRONIC INFLAMMATION. - NO DYSPLASIA OR  MALIGNANCY.  Interval History: She presents today for incision evaluation.  She called the office yesterday reporting intermittent, bright red bleeding from the incision along with firmness palpated around the sutures.  She reported that the area was red and raw appeared to maybe be coming apart.  No fever reported.  Does report an odor.  She has been performing sitz baths 3-4 times daily and using the peri-bottle after toileting.  She reports moderate to severe pain when sitting on the area.  She is not sure about returning to work since she will be sitting for 8 hours each day. Reports intermittent leakage of urine. No other concerns voiced.  Review of Systems  Constitutional: Feels soreness with incision.  No fever, chills.  Cardiovascular: No chest pain, shortness of breath, or edema.  Pulmonary: No cough or wheeze.  Gastrointestinal: No nausea, vomiting, or diarrhea. No bright red blood per rectum or change in bowel movement.  Genitourinary: No frequency, urgency, or dysuria. No vaginal bleeding or discharge. Minimal bleeding reported from vulvar incision.  Musculoskeletal: No myalgia or joint pain. Neurologic: No weakness, numbness, or change in gait.  Psychology: No depression, anxiety, or insomnia.  Current Meds:  Outpatient Encounter Medications as of 10/14/2017  Medication Sig  . acetaminophen (TYLENOL) 325 MG tablet Take 650 mg by mouth every 6 (six) hours as needed.  Marland Kitchen ibuprofen (ADVIL,MOTRIN) 200 MG tablet Take 200 mg by mouth as needed.   No facility-administered encounter medications on file as of 10/14/2017.     Allergy:  Allergies  Allergen Reactions  . Latex Rash    Social Hx:  Social History   Socioeconomic History  . Marital status: Single    Spouse name: Not on file  . Number of children: Not on file  . Years of education: Not on file  . Highest education level: Not on file  Occupational History  . Not on file  Social Needs  . Financial resource strain:  Not on file  . Food insecurity:    Worry: Not on file    Inability: Not on file  . Transportation needs:    Medical: Not on file    Non-medical: Not on file  Tobacco Use  . Smoking status: Current Every Day Smoker    Packs/day: 0.50    Years: 40.00    Pack years: 20.00    Types: Cigarettes  . Smokeless tobacco: Never Used  . Tobacco comment: Patient smokes 1/2 pack of cigarettes a day  Substance and Sexual Activity  . Alcohol use: Yes    Comment: 2 beers a week  . 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 Sexual activity: Not on file  Lifestyle  . Physical activity:    Days per week: Not on file    Minutes per session: Not on file  . Stress: Not on file  Relationships  . Social connections:    Talks on phone: Not on file    Gets together: Not on file    Attends religious service: Not on file    Active member of club or organization: Not on file    Attends meetings of clubs or organizations: Not on file    Relationship status: Not on file  . Intimate partner violence:    Fear of current or ex partner: Not on file    Emotionally abused: Not on file    Physically abused: Not on file    Forced sexual activity: Not on file  Other Topics Concern  . Not on file  Social History Narrative  . Not on file    Past Surgical Hx:  Past Surgical History:  Procedure Laterality Date  . CERVICAL CONE BIOPSY  1996   Laser  . LASER ABLATION OF THE CERVIX    . MYRINGOTOMY     Bilateral, age 32 and 49  . TONSILLECTOMY AND ADENOIDECTOMY    . TOTAL VAGINAL HYSTERECTOMY  07/2007   TVH-menorrhagia, dysmenorrhea, still have fallopians tubes and ovaries  . TUBAL LIGATION     Tubal Ligation/Steriization  . VULVECTOMY N/A 09/25/2017   Procedure: PARTIAL VULVECTOMY;  Surgeon: Isabel Caprice, MD;  Location: [facility];  Service: Gynecology;  Laterality: N/A;  . WISDOM TOOTH EXTRACTION      Past Medical Hx:  Past Medical  History:  Diagnosis Date  . Anxiety   . Arthritis of carpometacarpal (CMC) joint of right thumb   . Bilateral carpal tunnel syndrome   . Breast cyst, right 12/18/2007   3 mm simple cyst at the 11 o'clock position  . Cervical dysplasia   . COPD (chronic obstructive pulmonary disease) ([place])    remote history of early symptoms  . DM (diabetes mellitus), gestational    25 years ago  . Drug addiction ([place])   . 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 GERD (gastroesophageal reflux disease)    TUMS, OTC treatment as needed  . HSV infection   . Low back pain   . Major depression   . Migraines   .  Obese   . Plantar fasciitis    resolved  . VIN II (vulvar intraepithelial neoplasia II)     Family Hx:  Family History  Problem Relation Age of Onset  . Heart failure Mother   . Diabetes Mother   . Breast cancer Paternal Grandmother   . Tuberculosis Paternal Grandmother   . COPD Maternal Grandmother   . Emphysema Maternal Grandmother   . Lung cancer Maternal Grandfather   . Heart failure Paternal Grandfather     Vitals:  Blood pressure 117/70, pulse 78, temperature 99 F (37.2 C), temperature source Oral, resp. rate 20, height 5\' 4"  (1.626 m), weight 181 lb 1.6 oz (82.1 kg), SpO2 97 %.  Physical Exam: General: Well developed, well nourished female in no acute distress. Alert and oriented x 3.  Cardiovascular: Regular rate and rhythm. S1 and S2 normal.  Lungs: Clear to auscultation bilaterally. No wheezes/crackles/rhonchi noted.  Genitourinary:    Vulva/vagina: Incision healing nicely with no drainage or erythema.  Mild separation noted along the superior border with last exam improving.     Urethra: No lesions or masses.   Extremities: No bilateral cyanosis, edema, or clubbing.    Dorothyann Gibbs, NP 10/15/2017, 2:13 PM

## 2017-10-14 NOTE — Patient Instructions (Signed)
Your incision is healing nicely.  Continue with post-op care and restrictions.  Plan to follow up as scheduled or sooner if needed.

## 2017-10-15 ENCOUNTER — Encounter: Payer: Self-pay | Admitting: Gynecologic Oncology

## 2017-10-30 NOTE — Progress Notes (Signed)
S: Here for wound check post partial vulvectomy No complaints since last visit  O:  Vitals:   11/05/17 1501  BP: 116/70  Pulse: 82  Resp: 18  Temp: 98.2 F (36.8 C)  SpO2: 100%   Pelvic : Vulvar site of excision well-healed.   A/P: Pathology and operative reports given previously  Followup with referring Ob/Gyn given VIN2 with negative margins  CC: Melvyn Novas, MD (Referring Ob/Gyn)

## 2017-11-05 ENCOUNTER — Encounter: Payer: Self-pay | Admitting: Obstetrics

## 2017-11-05 ENCOUNTER — Inpatient Hospital Stay (HOSPITAL_BASED_OUTPATIENT_CLINIC_OR_DEPARTMENT_OTHER): Payer: BLUE CROSS/BLUE SHIELD | Admitting: Obstetrics

## 2017-11-05 VITALS — BP 116/70 | HR 82 | Temp 98.2°F | Resp 18 | Ht 64.0 in | Wt 181.0 lb

## 2017-11-05 DIAGNOSIS — Z9889 Other specified postprocedural states: Secondary | ICD-10-CM

## 2017-11-05 DIAGNOSIS — N901 Moderate vulvar dysplasia: Secondary | ICD-10-CM

## 2017-11-05 NOTE — Patient Instructions (Addendum)
Followup with [name] in the next 12 months for vulvar exam.

## 2019-02-10 LAB — HM MAMMOGRAPHY

## 2019-03-15 ENCOUNTER — Other Ambulatory Visit: Payer: Self-pay

## 2019-03-15 LAB — HM MAMMOGRAPHY

## 2019-03-18 ENCOUNTER — Encounter: Payer: Self-pay | Admitting: General Practice

## 2019-04-08 ENCOUNTER — Other Ambulatory Visit: Payer: Self-pay

## 2019-04-09 ENCOUNTER — Ambulatory Visit (INDEPENDENT_AMBULATORY_CARE_PROVIDER_SITE_OTHER): Payer: 59 | Admitting: Family Medicine

## 2019-04-09 ENCOUNTER — Ambulatory Visit (INDEPENDENT_AMBULATORY_CARE_PROVIDER_SITE_OTHER): Payer: 59

## 2019-04-09 ENCOUNTER — Encounter: Payer: Self-pay | Admitting: Family Medicine

## 2019-04-09 VITALS — BP 120/80 | HR 81 | Temp 98.7°F | Ht 64.0 in | Wt 187.8 lb

## 2019-04-09 DIAGNOSIS — M542 Cervicalgia: Secondary | ICD-10-CM

## 2019-04-09 DIAGNOSIS — N951 Menopausal and female climacteric states: Secondary | ICD-10-CM

## 2019-04-09 DIAGNOSIS — K219 Gastro-esophageal reflux disease without esophagitis: Secondary | ICD-10-CM | POA: Diagnosis not present

## 2019-04-09 DIAGNOSIS — G8929 Other chronic pain: Secondary | ICD-10-CM

## 2019-04-09 DIAGNOSIS — M545 Low back pain: Secondary | ICD-10-CM

## 2019-04-09 DIAGNOSIS — R32 Unspecified urinary incontinence: Secondary | ICD-10-CM | POA: Insufficient documentation

## 2019-04-09 DIAGNOSIS — E669 Obesity, unspecified: Secondary | ICD-10-CM

## 2019-04-09 DIAGNOSIS — J42 Unspecified chronic bronchitis: Secondary | ICD-10-CM

## 2019-04-09 DIAGNOSIS — G5603 Carpal tunnel syndrome, bilateral upper limbs: Secondary | ICD-10-CM | POA: Diagnosis not present

## 2019-04-09 DIAGNOSIS — F17200 Nicotine dependence, unspecified, uncomplicated: Secondary | ICD-10-CM

## 2019-04-09 DIAGNOSIS — Z23 Encounter for immunization: Secondary | ICD-10-CM

## 2019-04-09 MED ORDER — GABAPENTIN 100 MG PO CAPS: 100.0000 mg | ORAL_CAPSULE | Freq: Every day | ORAL | 3 refills | Status: DC

## 2019-04-09 MED ORDER — BREZTRI AEROSPHERE 160-9-4.8 MCG/ACT IN AERO: 2.0000 | INHALATION_SPRAY | Freq: Two times a day (BID) | RESPIRATORY_TRACT | 5 refills | Status: DC

## 2019-04-09 NOTE — Patient Instructions (Signed)
It was so good seeing you again! Thank you for establishing with my new practice and allowing me to continue caring for you. It means a lot to me.   Please schedule a follow up appointment with me in 1 month for recheck irritability and hot flashes; also make an appt in 3 months for annual physical.    Menopause Menopause is the normal time of life when menstrual periods stop completely. It is usually confirmed by 12 months without a menstrual period. The transition to menopause (perimenopause) most often happens between the ages of 70 and 96. During perimenopause, hormone levels change in your body, which can cause symptoms and affect your health. Menopause may increase your risk for:  Loss of bone (osteoporosis), which causes bone breaks (fractures).  Depression.  Hardening and narrowing of the arteries (atherosclerosis), which can cause heart attacks and strokes. What are the causes? This condition is usually caused by a natural change in hormone levels that happens as you get older. The condition may also be caused by surgery to remove both ovaries (bilateral oophorectomy). What increases the risk? This condition is more likely to start at an earlier age if you have certain medical conditions or treatments, including:  A tumor of the pituitary gland in the brain.  A disease that affects the ovaries and hormone production.  Radiation treatment for cancer.  Certain cancer treatments, such as chemotherapy or hormone (anti-estrogen) therapy.  Heavy smoking and excessive alcohol use.  Family history of early menopause. This condition is also more likely to develop earlier in women who are very thin. What are the signs or symptoms? Symptoms of this condition include:  Hot flashes.  Irregular menstrual periods.  Night sweats.  Changes in feelings about sex. This could be a decrease in sex drive or an increased comfort around your sexuality.  Vaginal dryness and thinning of the  vaginal walls. This may cause painful intercourse.  Dryness of the skin and development of wrinkles.  Headaches.  Problems sleeping (insomnia).  Mood swings or irritability.  Memory problems.  Weight gain.  Hair growth on the face and chest.  Bladder infections or problems with urinating. How is this diagnosed? This condition is diagnosed based on your medical history, a physical exam, your age, your menstrual history, and your symptoms. Hormone tests may also be done. How is this treated? In some cases, no treatment is needed. You and your health care provider should make a decision together about whether treatment is necessary. Treatment will be based on your individual condition and preferences. Treatment for this condition focuses on managing symptoms. Treatment may include:  Menopausal hormone therapy (MHT).  Medicines to treat specific symptoms or complications.  Acupuncture.  Vitamin or herbal supplements. Before starting treatment, make sure to let your health care provider know if you have a personal or family history of:  Heart disease.  Breast cancer.  Blood clots.  Diabetes.  Osteoporosis. Follow these instructions at home: Lifestyle  Do not use any products that contain nicotine or tobacco, such as cigarettes and e-cigarettes. If you need help quitting, ask your health care provider.  Get at least 30 minutes of physical activity on 5 or more days each week.  Avoid alcoholic and caffeinated beverages, as well as spicy foods. This may help prevent hot flashes.  Get 7-8 hours of sleep each night.  If you have hot flashes, try: ? Dressing in layers. ? Avoiding things that may trigger hot flashes, such as spicy food, warm  places, or stress. ? Taking slow, deep breaths when a hot flash starts. ? Keeping a fan in your home and office.  Find ways to manage stress, such as deep breathing, meditation, or journaling.  Consider going to group therapy with  other women who are having menopause symptoms. Ask your health care provider about recommended group therapy meetings. Eating and drinking  Eat a healthy, balanced diet that contains whole grains, lean protein, low-fat dairy, and plenty of fruits and vegetables.  Your health care provider may recommend adding more soy to your diet. Foods that contain soy include tofu, tempeh, and soy milk.  Eat plenty of foods that contain calcium and vitamin D for bone health. Items that are rich in calcium include low-fat milk, yogurt, beans, almonds, sardines, broccoli, and kale. Medicines  Take over-the-counter and prescription medicines only as told by your health care provider.  Talk with your health care provider before starting any herbal supplements. If prescribed, take vitamins and supplements as told by your health care provider. These may include: ? Calcium. Women age 71 and older should get 1,200 mg (milligrams) of calcium every day. ? Vitamin D. Women need 600-800 International Units of vitamin D each day. ? Vitamins B12 and B6. Aim for 50 micrograms of B12 and 1.5 mg of B6 each day. General instructions  Keep track of your menstrual periods, including: ? When they occur. ? How heavy they are and how long they last. ? How much time passes between periods.  Keep track of your symptoms, noting when they start, how often you have them, and how long they last.  Use vaginal lubricants or moisturizers to help with vaginal dryness and improve comfort during sex.  Keep all follow-up visits as told by your health care provider. This is important. This includes any group therapy or counseling. Contact a health care provider if:  You are still having menstrual periods after age 53.  You have pain during sex.  You have not had a period for 12 months and you develop vaginal bleeding. Get help right away if:  You have: ? Severe depression. ? Excessive vaginal bleeding. ? Pain when you urinate.  ? A fast or irregular heart beat (palpitations). ? Severe headaches. ? Abdomen (abdominal) pain or severe indigestion.  You fell and you think you have a broken bone.  You develop leg or chest pain.  You develop vision problems.  You feel a lump in your breast. Summary  Menopause is the normal time of life when menstrual periods stop completely. It is usually confirmed by 12 months without a menstrual period.  The transition to menopause (perimenopause) most often happens between the ages of 40 and 56.  Symptoms can be managed through medicines, lifestyle changes, and complementary therapies such as acupuncture.  Eat a balanced diet that is rich in nutrients to promote bone health and heart health and to manage symptoms during menopause. This information is not intended to replace advice given to you by your health care provider. Make sure you discuss any questions you have with your health care provider. Document Revised: 12/13/2016 Document Reviewed: 02/03/2016 Elsevier Patient Education  2020 [employer].  Steps to Quit Smoking Smoking tobacco is the leading cause of preventable death. It can affect almost every organ in the body. Smoking puts you and those around you at risk for developing many serious chronic diseases. Quitting smoking can be difficult, but it is one of the best things that you can do for your  health. It is never too late to quit. How do I get ready to quit? When you decide to quit smoking, create a plan to help you succeed. Before you quit:  Pick a date to quit. Set a date within the next 2 weeks to give you time to prepare.  Write down the reasons why you are quitting. Keep this list in places where you will see it often.  Tell your family, friends, and co-workers that you are quitting. Support from your loved ones can make quitting easier.  Talk with your health care provider about your options for quitting smoking.  Find out what treatment options are  covered by your health insurance.  Identify people, places, things, and activities that make you want to smoke (triggers). Avoid them. What first steps can I take to quit smoking?  Throw away all cigarettes at home, at work, and in your car.  Throw away smoking accessories, such as Scientist, research (medical).  Clean your car. Make sure to empty the ashtray.  Clean your home, including curtains and carpets. What strategies can I use to quit smoking? Talk with your health care provider about combining strategies, such as taking medicines while you are also receiving in-person counseling. Using these two strategies together makes you more likely to succeed in quitting than if you used either strategy on its own.  If you are pregnant or breastfeeding, talk with your health care provider about finding counseling or other support strategies to quit smoking. Do not take medicine to help you quit smoking unless your health care provider tells you to do so. To quit smoking: Quit right away  Quit smoking completely, instead of gradually reducing how much you smoke over a period of time. Research shows that stopping smoking right away is more successful than gradually quitting.  Attend in-person counseling to help you build problem-solving skills. You are more likely to succeed in quitting if you attend counseling sessions regularly. Even short sessions of 10 minutes can be effective. Take medicine You may take medicines to help you quit smoking. Some medicines require a prescription and some you can purchase over-the-counter. Medicines may have nicotine in them to replace the nicotine in cigarettes. Medicines may:  Help to stop cravings.  Help to relieve withdrawal symptoms. Your health care provider may recommend:  Nicotine patches, gum, or lozenges.  Nicotine inhalers or sprays.  Non-nicotine medicine that is taken by mouth. Find resources Find resources and support systems that can help you  to quit smoking and remain smoke-free after you quit. These resources are most helpful when you use them often. They include:  Online chats with a Social worker.  Telephone quitlines.  Printed Furniture conservator/restorer.  Support groups or group counseling.  Text messaging programs.  Mobile phone apps or applications. Use apps that can help you stick to your quit plan by providing reminders, tips, and encouragement. There are many free apps for mobile devices as well as websites. Examples include Quit Guide from the [employer] and smokefree.gov What things can I do to make it easier to quit?   Reach out to your family and friends for support and encouragement. Call telephone quitlines (1-800-QUIT-NOW), reach out to support groups, or work with a counselor for support.  Ask people who smoke to avoid smoking around you.  Avoid places that trigger you to smoke, such as bars, parties, or smoke-break areas at work.  Spend time with people who do not smoke.  Lessen the stress in your life. Stress  can be a smoking trigger for some people. To lessen stress, try: ? Exercising regularly. ? Doing deep-breathing exercises. ? Doing yoga. ? Meditating. ? Performing a body scan. This involves closing your eyes, scanning your body from head to toe, and noticing which parts of your body are particularly tense. Try to relax the muscles in those areas. How will I feel when I quit smoking? Day 1 to 3 weeks Within the first 24 hours of quitting smoking, you may start to feel withdrawal symptoms. These symptoms are usually most noticeable 2-3 days after quitting, but they usually do not last for more than 2-3 weeks. You may experience these symptoms:  Mood swings.  Restlessness, anxiety, or irritability.  Trouble concentrating.  Dizziness.  Strong cravings for sugary foods and nicotine.  Mild weight gain.  Constipation.  Nausea.  Coughing or a sore throat.  Changes in how the medicines that you take for  unrelated issues work in your body.  Depression.  Trouble sleeping (insomnia). Week 3 and afterward After the first 2-3 weeks of quitting, you may start to notice more positive results, such as:  Improved sense of smell and taste.  Decreased coughing and sore throat.  Slower heart rate.  Lower blood pressure.  Clearer skin.  The ability to breathe more easily.  Fewer sick days. Quitting smoking can be very challenging. Do not get discouraged if you are not successful the first time. Some people need to make many attempts to quit before they achieve long-term success. Do your best to stick to your quit plan, and talk with your health care provider if you have any questions or concerns. Summary  Smoking tobacco is the leading cause of preventable death. Quitting smoking is one of the best things that you can do for your health.  When you decide to quit smoking, create a plan to help you succeed.  Quit smoking right away, not slowly over a period of time.  When you start quitting, seek help from your health care provider, family, or friends. This information is not intended to replace advice given to you by your health care provider. Make sure you discuss any questions you have with your health care provider. Document Revised: 09/25/2018 Document Reviewed: 03/21/2018 Elsevier Patient Education  [place].

## 2019-04-09 NOTE — Progress Notes (Signed)
Subjective  CC:  Chief Complaint  Patient presents with  . New Patient (Initial Visit)    last seen Dr. Jonni Sanger at [facility] over 5-6 years ago. discuss joint pain.  last CPE >5 yrs ago  . Insomnia    irrratable. discuss menopause    HPI: [name] is a 54 y.o. female is a former [facility] patient and is here to reestablish care with me today.   I reviewed multiple old records dating back to 2014. Updated PL accordingly and discussed PMH in detail.  She has the following concerns or needs:  54 yo female, common law marriage, working full time, long term smoker with COPD presents to discuss breathing, menopausal sxs, GERD  Smoker > 40 years. Now contemplating quitting. Has known copd untreated for years. Uses her friends albuterol daily due to wheezing and SOB. No hospitalizations or ER visits for copd exacerbations. Hasn't had pfts. Albuterol helps her sxs. Would like to quit smoking (has had VIN2 and copd as complications) but fears it greatly. Doesn't want weight gain either. Has failed chantix in the past. Discussed nictoine addiction and anxiety reliever and habit as main drivers of use .  CTS: seeing dr. Amedeo Plenty  GERD sxs are active  C/o neck and back pain w/o radicular sxs; for years and worsening.   Menopausal sxs: hot flushes, poor sleep and increased irritability are problematic. S/p hysterectomy for nonmalignant reason.   Overdue for HM / cpe/ labs. H/o substance abuse: remains sober.  Assessment  1. Chronic bronchitis, unspecified chronic bronchitis type ([place])   2. Bilateral carpal tunnel syndrome   3. Chronic midline low back pain without sciatica   4. Gastroesophageal reflux disease without esophagitis   5. Obesity (BMI 35.0-39.9 without comorbidity)   6. Tobacco use disorder   7. Menopausal symptoms   8. Neck pain   9. Need for Tdap vaccination      Plan   COPD: discussed treatment. Clear lungs today. Start Brextri. Close f/u.   Back and neck pain: check  xrays  Smoking: started counseling and education for smoking cessation. Encouraged that she is now considering working on quitting.   Menopausal sxs: discussed options and elect trial of gabapentin. Check fsh and thyroid  CTS per Dr. Amedeo Plenty  GERD: monitor for now. May consider PPI  Check labs.  Follow up:  No follow-ups on file.  Orders Placed This Encounter  Procedures  . DG Lumbar Spine 2-3 Views  . DG Cervical Spine 2 or 3 views  . Tdap vaccine greater than or equal to 7yo IM  . TSH  . Lipid panel  . Comprehensive metabolic panel  . CBC with Differential/Platelet  . Follicle stimulating hormone   Meds ordered this encounter  Medications  . Budeson-Glycopyrrol-Formoterol (BREZTRI AEROSPHERE) 160-9-4.8 MCG/ACT AERO    Sig: Inhale 2 puffs into the lungs in the morning and at bedtime.    Dispense:  10.7 g    Refill:  5  . gabapentin (NEURONTIN) 100 MG capsule    Sig: Take 1-3 capsules (100-300 mg total) by mouth at bedtime.    Dispense:  90 capsule    Refill:  3      We updated and reviewed the patient's past history in detail and it is documented below.  Patient Active Problem List   Diagnosis Date Noted  . Urinary incontinence 04/09/2019  . 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 Bilateral carpal tunnel syndrome 06/24/2017  . Primary osteoarthritis of first  carpometacarpal joint of right hand 06/24/2017  . Chronic low back pain 06/23/2012  . COPD (chronic obstructive pulmonary disease) ([place]) 11/19/2010  . Drug addiction in remission ([place]) 11/19/2010    NA with sponsors; good support. Some relapses   . Common migraine 03/23/2010  . GERD (gastroesophageal reflux disease) 03/12/2010  . Obesity (BMI 35.0-39.9 without comorbidity) 03/12/2010    Obesity   . Tobacco use disorder 12/11/2009    Precontemplative; does not want to quit 05/2012/cla   . Major depression, recurrent ([place]) 03/03/2009   Health Maintenance  Topic Date Due  . HIV  Screening  Never done  . COLONOSCOPY  Never done  . MAMMOGRAM  03/14/2021  . TETANUS/TDAP  04/08/2029  . INFLUENZA VACCINE  Completed   Immunization History  Administered Date(s) Administered  . Hepatitis B, ped/adol 05/29/2007  . Influenza Inj Mdck Quad Pf 12/14/2018  . Influenza Split 11/30/2007, 11/16/2008, 10/28/2009, 01/27/2012  . Influenza, Seasonal, Injecte, Preservative Fre 10/01/2013  . Influenza,trivalent, recombinat, inj, PF 11/19/2010  . PPD Test 12/12/2008  . Pneumococcal Conjugate-13 11/09/2009  . Pneumococcal Polysaccharide-23 01/27/2012  . Td 05/29/2006  . Tdap 04/09/2019   Current Meds  Medication Sig  . acetaminophen (TYLENOL) 325 MG tablet Take 650 mg by mouth every 6 (six) hours as needed.  . ASPIRIN 81 PO Take by mouth.  Marland Kitchen ibuprofen (ADVIL,MOTRIN) 200 MG tablet Take 200 mg by mouth as needed.    Allergies: Patient is allergic to metronidazole and latex. 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 Past Surgical History Patient  has a past surgical history that includes Tonsillectomy and adenoidectomy; Tubal ligation; Cervical cone biopsy (1996); Total vaginal hysterectomy (07/2007); Laser ablation of the cervix; Myringotomy; Wisdom tooth extraction; and Vulvectomy (N/A, 09/25/2017). Family History: Patient family history includes Arthritis in her maternal grandmother; Breast cancer in her paternal grandmother; COPD in her maternal grandmother; Diabetes in her mother; Emphysema in her maternal grandmother; Heart failure in her mother  and paternal grandfather; High blood pressure in her paternal grandfather; Lung cancer in her maternal grandfather; Stroke in her paternal grandmother; Tuberculosis in her paternal grandmother. Social History:  Patient  reports that she has been smoking cigarettes. She has a 20.00 pack-year smoking history. She has never used smokeless tobacco. She reports current alcohol use. She reports previous drug use. Drugs: Marijuana and "Crack" cocaine.  Review of Systems: Constitutional: negative for fever or malaise Ophthalmic: negative for photophobia, double vision or loss of vision Cardiovascular: negative for chest pain, dyspnea on exertion, or new LE swelling Respiratory: +for SOB or persistent cough Gastrointestinal: negative for abdominal pain, change in bowel habits or melena Genitourinary: negative for dysuria or gross hematuria Musculoskeletal: negative for new gait disturbance or muscular weakness Integumentary: negative for new or persistent rashes Neurological: negative for TIA or stroke symptoms Psychiatric: negative for SI or delusions Allergic/Immunologic: negative for hives  Patient Care Team    Relationship Specialty Notifications Start End  Leamon Arnt, MD PCP - General Family Medicine  04/09/19   Isabel Caprice, MD Consulting Physician Gynecologic Oncology  04/09/19   Janyth Contes, MD Consulting Physician Obstetrics and Gynecology  04/09/19  Objective  Vitals: BP 120/80 (BP Location: Right Arm, Patient Position: Sitting, Cuff Size: Normal)   Pulse 81   Temp 98.7 F (37.1 C) (Temporal)   Ht 5\' 4"  (1.626 m)   Wt 187 lb 12.8 oz (85.2 kg)   SpO2 94%   BMI 32.24 kg/m  General:  Well developed, well nourished, no acute distress  Psych:  Alert and oriented,normal mood and affect HEENT:  Normocephalic, atraumatic, non-icteric sclera, PERRL,  Cardiovascular:  RRR without gallop, rub or murmur Respiratory:  Good breath sounds bilaterally, CTAB with normal  respiratory effort Neurologic:    Mental status is normal.  Commons side effects, risks, benefits, and alternatives for medications and treatment plan prescribed today were discussed, and the patient expressed understanding of the given instructions. Patient is instructed to call or message via MyChart if he/she has any questions or concerns regarding our treatment plan. No barriers to understanding were identified. We discussed Red Flag symptoms and signs in detail. Patient expressed understanding regarding what to do in case of urgent or emergency type symptoms.   Medication list was reconciled, printed and provided to the patient in AVS. Patient instructions and summary information was reviewed with the patient as documented in the AVS. This note was prepared with assistance of Dragon voice recognition software. Occasional wrong-word or sound-a-like substitutions may have occurred due to the inherent limitations of voice recognition software

## 2019-04-10 LAB — CBC WITH DIFFERENTIAL/PLATELET
Absolute Monocytes: 694 cells/uL (ref 200–950)
Basophils Absolute: 51 cells/uL (ref 0–200)
Basophils Relative: 0.5 %
Eosinophils Absolute: 561 cells/uL — ABNORMAL HIGH (ref 15–500)
Eosinophils Relative: 5.5 %
HCT: 40.8 % (ref 35.0–45.0)
Hemoglobin: 13.9 g/dL (ref 11.7–15.5)
Lymphs Abs: 3774 cells/uL (ref 850–3900)
MCH: 31.7 pg (ref 27.0–33.0)
MCHC: 34.1 g/dL (ref 32.0–36.0)
MCV: 92.9 fL (ref 80.0–100.0)
MPV: 11.3 fL (ref 7.5–12.5)
Monocytes Relative: 6.8 %
Neutro Abs: 5120 cells/uL (ref 1500–7800)
Neutrophils Relative %: 50.2 %
Platelets: 262 10*3/uL (ref 140–400)
RBC: 4.39 10*6/uL (ref 3.80–5.10)
RDW: 12.5 % (ref 11.0–15.0)
Total Lymphocyte: 37 %
WBC: 10.2 10*3/uL (ref 3.8–10.8)

## 2019-04-10 LAB — LIPID PANEL
Cholesterol: 150 mg/dL (ref <200)
HDL: 51 mg/dL (ref >=50)
LDL Cholesterol (Calc): 81 mg/dL (calc)
Non-HDL Cholesterol (Calc): 99 mg/dL (calc) (ref <130)
Total CHOL/HDL Ratio: 2.9 (calc) (ref <5.0)
Triglycerides: 99 mg/dL (ref <150)

## 2019-04-10 LAB — COMPREHENSIVE METABOLIC PANEL
AG Ratio: 2.2 (calc) (ref 1.0–2.5)
ALT: 15 U/L (ref 6–29)
AST: 14 U/L (ref 10–35)
Albumin: 4.3 g/dL (ref 3.6–5.1)
Alkaline phosphatase (APISO): 57 U/L (ref 37–153)
BUN/Creatinine Ratio: 15 (calc) (ref 6–22)
BUN: 16 mg/dL (ref 7–25)
CO2: 27 mmol/L (ref 20–32)
Calcium: 9.6 mg/dL (ref 8.6–10.4)
Chloride: 106 mmol/L (ref 98–110)
Creat: 1.07 mg/dL — ABNORMAL HIGH (ref 0.50–1.05)
Globulin: 2 g/dL (calc) (ref 1.9–3.7)
Glucose, Bld: 90 mg/dL (ref 65–99)
Potassium: 4.7 mmol/L (ref 3.5–5.3)
Sodium: 142 mmol/L (ref 135–146)
Total Bilirubin: 0.3 mg/dL (ref 0.2–1.2)
Total Protein: 6.3 g/dL (ref 6.1–8.1)

## 2019-04-10 LAB — FOLLICLE STIMULATING HORMONE: FSH: 31.5 m[IU]/mL

## 2019-04-10 LAB — TSH: TSH: 2.71 mIU/L

## 2019-04-14 ENCOUNTER — Encounter: Payer: Self-pay | Admitting: Family Medicine

## 2019-04-16 ENCOUNTER — Encounter (INDEPENDENT_AMBULATORY_CARE_PROVIDER_SITE_OTHER): Payer: Self-pay

## 2019-05-17 ENCOUNTER — Ambulatory Visit: Payer: 59 | Admitting: Family Medicine

## 2019-05-17 ENCOUNTER — Encounter: Payer: Self-pay | Admitting: Family Medicine

## 2019-05-17 ENCOUNTER — Other Ambulatory Visit: Payer: Self-pay

## 2019-05-17 ENCOUNTER — Ambulatory Visit (INDEPENDENT_AMBULATORY_CARE_PROVIDER_SITE_OTHER): Payer: 59 | Admitting: Family Medicine

## 2019-05-17 VITALS — BP 118/70 | HR 72 | Temp 98.4°F | Resp 18 | Ht 64.0 in | Wt 190.4 lb

## 2019-05-17 DIAGNOSIS — N951 Menopausal and female climacteric states: Secondary | ICD-10-CM

## 2019-05-17 DIAGNOSIS — Z1211 Encounter for screening for malignant neoplasm of colon: Secondary | ICD-10-CM | POA: Diagnosis not present

## 2019-05-17 DIAGNOSIS — F17200 Nicotine dependence, unspecified, uncomplicated: Secondary | ICD-10-CM

## 2019-05-17 DIAGNOSIS — Z1212 Encounter for screening for malignant neoplasm of rectum: Secondary | ICD-10-CM

## 2019-05-17 DIAGNOSIS — J42 Unspecified chronic bronchitis: Secondary | ICD-10-CM | POA: Diagnosis not present

## 2019-05-17 MED ORDER — BUPROPION HCL ER (SMOKING DET) 150 MG PO TB12: 150.0000 mg | ORAL_TABLET | Freq: Two times a day (BID) | ORAL | 3 refills | Status: DC

## 2019-05-17 NOTE — Patient Instructions (Signed)
Please return in 3 months for your annual complete physical; please come fasting. You may cancel your appointment in May.   If you have any questions or concerns, please don't hesitate to send me a message via MyChart or call the office at [phone]. Thank you for visiting with [place] today! It's our pleasure caring for you.  We will call you with information regarding your referral appointment. Gastroenterology for colonoscopy.  If you do not hear from [place] within the next 2 weeks, please let me know. It can take 1-2 weeks to get appointments set up with the specialists.   Quit smoking. Return if you need more help.  When you start the zyban, start once daily for 3 days, then increase to twice a day.    Steps to Quit Smoking Smoking tobacco is the leading cause of preventable death. It can affect almost every organ in the body. Smoking puts you and those around you at risk for developing many serious chronic diseases. Quitting smoking can be difficult, but it is one of the best things that you can do for your health. It is never too late to quit. How do I get ready to quit? When you decide to quit smoking, create a plan to help you succeed. Before you quit:  Pick a date to quit. Set a date within the next 2 weeks to give you time to prepare.  Write down the reasons why you are quitting. Keep this list in places where you will see it often.  Tell your family, friends, and co-workers that you are quitting. Support from your loved ones can make quitting easier.  Talk with your health care provider about your options for quitting smoking.  Find out what treatment options are covered by your health insurance.  Identify people, places, things, and activities that make you want to smoke (triggers). Avoid them. What first steps can I take to quit smoking?  Throw away all cigarettes at home, at work, and in your car.  Throw away smoking accessories, such as Scientist, research (medical).  Clean your  car. Make sure to empty the ashtray.  Clean your home, including curtains and carpets. What strategies can I use to quit smoking? Talk with your health care provider about combining strategies, such as taking medicines while you are also receiving in-person counseling. Using these two strategies together makes you more likely to succeed in quitting than if you used either strategy on its own.  If you are pregnant or breastfeeding, talk with your health care provider about finding counseling or other support strategies to quit smoking. Do not take medicine to help you quit smoking unless your health care provider tells you to do so. To quit smoking: Quit right away  Quit smoking completely, instead of gradually reducing how much you smoke over a period of time. Research shows that stopping smoking right away is more successful than gradually quitting.  Attend in-person counseling to help you build problem-solving skills. You are more likely to succeed in quitting if you attend counseling sessions regularly. Even short sessions of 10 minutes can be effective. Take medicine You may take medicines to help you quit smoking. Some medicines require a prescription and some you can purchase over-the-counter. Medicines may have nicotine in them to replace the nicotine in cigarettes. Medicines may:  Help to stop cravings.  Help to relieve withdrawal symptoms. Your health care provider may recommend:  Nicotine patches, gum, or lozenges.  Nicotine inhalers or sprays.  Non-nicotine medicine  that is taken by mouth. Find resources Find resources and support systems that can help you to quit smoking and remain smoke-free after you quit. These resources are most helpful when you use them often. They include:  Online chats with a Social worker.  Telephone quitlines.  Printed Furniture conservator/restorer.  Support groups or group counseling.  Text messaging programs.  Mobile phone apps or applications. Use  apps that can help you stick to your quit plan by providing reminders, tips, and encouragement. There are many free apps for mobile devices as well as websites. Examples include Quit Guide from the [employer] and smokefree.gov What things can I do to make it easier to quit?   Reach out to your family and friends for support and encouragement. Call telephone quitlines (1-800-QUIT-NOW), reach out to support groups, or work with a counselor for support.  Ask people who smoke to avoid smoking around you.  Avoid places that trigger you to smoke, such as bars, parties, or smoke-break areas at work.  Spend time with people who do not smoke.  Lessen the stress in your life. Stress can be a smoking trigger for some people. To lessen stress, try: ? Exercising regularly. ? Doing deep-breathing exercises. ? Doing yoga. ? Meditating. ? Performing a body scan. This involves closing your eyes, scanning your body from head to toe, and noticing which parts of your body are particularly tense. Try to relax the muscles in those areas. How will I feel when I quit smoking? Day 1 to 3 weeks Within the first 24 hours of quitting smoking, you may start to feel withdrawal symptoms. These symptoms are usually most noticeable 2-3 days after quitting, but they usually do not last for more than 2-3 weeks. You may experience these symptoms:  Mood swings.  Restlessness, anxiety, or irritability.  Trouble concentrating.  Dizziness.  Strong cravings for sugary foods and nicotine.  Mild weight gain.  Constipation.  Nausea.  Coughing or a sore throat.  Changes in how the medicines that you take for unrelated issues work in your body.  Depression.  Trouble sleeping (insomnia). Week 3 and afterward After the first 2-3 weeks of quitting, you may start to notice more positive results, such as:  Improved sense of smell and taste.  Decreased coughing and sore throat.  Slower heart rate.  Lower blood  pressure.  Clearer skin.  The ability to breathe more easily.  Fewer sick days. Quitting smoking can be very challenging. Do not get discouraged if you are not successful the first time. Some people need to make many attempts to quit before they achieve long-term success. Do your best to stick to your quit plan, and talk with your health care provider if you have any questions or concerns. Summary  Smoking tobacco is the leading cause of preventable death. Quitting smoking is one of the best things that you can do for your health.  When you decide to quit smoking, create a plan to help you succeed.  Quit smoking right away, not slowly over a period of time.  When you start quitting, seek help from your health care provider, family, or friends. This information is not intended to replace advice given to you by your health care provider. Make sure you discuss any questions you have with your health care provider. Document Revised: 09/25/2018 Document Reviewed: 03/21/2018 Elsevier Patient Education  [place].

## 2019-05-17 NOTE — Progress Notes (Signed)
Subjective  CC:  Chief Complaint  Patient presents with  . Menopause    No new concerns at this time.     HPI: [name] is a 54 y.o. female who presents to the office today to address the problems listed above in the chief complaint.  Menopause f/u: see last note. Started gabapentin at night due to hot flashes, poor sleep, and irritability. FSH was elevated in the 30s. Not a great HRT candidate due to smoker and CV risk. Prefers to stay away from [facility] due to decreased libido SEs. Reports is doing much better. Sleep is better; less hot flashes feeling more rested. However feels tired by late afternoon. ? Related to gabapentin. However, no am grogginess.   Copd: started brextri due to wheezing and frequent albuterol use. Improved as well. Less albuterol need. Less sob. Less cough.   Smoking cessation: has made some changes. Decreased use by 25 %. Getting ready to quit.     Assessment  1. Menopausal symptoms   2. Encounter for colorectal cancer screening   3. Chronic bronchitis, unspecified chronic bronchitis type ([place])   4. Tobacco use disorder      Plan   Menopausal syndrome:  Improving. Increase to300mg  gabapentin nightly. disucssed late afternoon crash due to fasting, coffee use and sweet intake. Recommend improving diet and hydration.   Refer to GI for CRC screen  COPD: improving on brextri  Smoking cessation. Further counseling done. Start wellbutrin/zyban for both tobacco cessation and menopausal irritability sxs. Educated on appropriate use and expectations. See quit plan.   Follow up: Return in about 3 months (around 08/17/2019) for complete physical.  07/12/2019  Orders Placed This Encounter  Procedures  . Ambulatory referral to Gastroenterology   Meds ordered this encounter  Medications  . buPROPion (ZYBAN) 150 MG 12 hr tablet    Sig: Take 1 tablet (150 mg total) by mouth 2 (two) times daily.    Dispense:  180 tablet    Refill:  3      I reviewed  the patients updated PMH, FH, and SocHx.    Patient Active Problem List   Diagnosis Date Noted  . Urinary incontinence 04/09/2019  . 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 Bilateral carpal tunnel syndrome 06/24/2017  . Primary osteoarthritis of first carpometacarpal joint of right hand 06/24/2017  . Chronic low back pain 06/23/2012  . COPD (chronic obstructive pulmonary disease) ([place]) 11/19/2010  . Drug addiction in remission ([place]) 11/19/2010  . Common migraine 03/23/2010  . GERD (gastroesophageal reflux disease) 03/12/2010  . Obesity (BMI 35.0-39.9 without comorbidity) 03/12/2010  . Tobacco use disorder 12/11/2009  . Major depression, recurrent ([place]) 03/03/2009   Current Meds  Medication Sig  . ASPIRIN 81 PO Take by mouth.  . Budeson-Glycopyrrol-Formoterol (BREZTRI AEROSPHERE) 160-9-4.8 MCG/ACT AERO Inhale 2 puffs into the lungs in the morning and at bedtime.  . gabapentin (NEURONTIN) 100 MG capsule Take 1-3 capsules (100-300 mg total) by mouth at bedtime.  Marland Kitchen ibuprofen (ADVIL,MOTRIN) 200 MG tablet Take 200 mg by mouth as needed.    Allergies: Patient is allergic to metronidazole and latex. Family History: Patient family history includes Arthritis in her maternal grandmother; Breast cancer in her paternal grandmother; COPD in her maternal grandmother; Diabetes in her mother; Emphysema in her maternal grandmother; Heart failure in her mother and paternal grandfather; High blood pressure in her paternal grandfather; Lung cancer in her maternal grandfather; Stroke in her paternal grandmother; Tuberculosis in her paternal grandmother.  Social History:  Patient  reports that she has been smoking cigarettes. She has a 20.00 pack-year smoking history. She has never used smokeless tobacco. She reports current alcohol use. She reports previous drug use. Drugs: Marijuana and "Crack" cocaine.  Review of Systems: Constitutional: Negative for fever malaise or  anorexia Cardiovascular: negative for chest pain Respiratory: negative for SOB or persistent cough Gastrointestinal: negative for abdominal pain  Objective  Vitals: BP 118/70   Pulse 72   Temp 98.4 F (36.9 C) (Temporal)   Resp 18   Ht 5\' 4"  (1.626 m)   Wt 190 lb 6.4 oz (86.4 kg)   SpO2 95%   BMI 32.68 kg/m  General: no acute distress , A&Ox3 HEENT: PEERL, conjunctiva normal, neck is supple Cardiovascular:  RRR without murmur or gallop.  Respiratory:  Good breath sounds bilaterally, CTAB with normal respiratory effort Skin:  Warm, no rashes  No visits with results within 1 Day(s) from this visit.  Latest known visit with results is:  Office Visit on 04/09/2019  Component Date Value Ref Range Status  . WBC 04/09/2019 10.2  3.8 - 10.8 Thousand/uL Final  . RBC 04/09/2019 4.39  3.80 - 5.10 Million/uL Final  . Hemoglobin 04/09/2019 13.9  11.7 - 15.5 g/dL Final  . HCT 04/09/2019 40.8  35.0 - 45.0 % Final  . MCV 04/09/2019 92.9  80.0 - 100.0 fL Final  . MCH 04/09/2019 31.7  27.0 - 33.0 pg Final  . MCHC 04/09/2019 34.1  32.0 - 36.0 g/dL Final  . RDW 04/09/2019 12.5  11.0 - 15.0 % Final  . Platelets 04/09/2019 262  140 - 400 Thousand/uL Final  . MPV 04/09/2019 11.3  7.5 - 12.5 fL Final  . Neutro Abs 04/09/2019 5,120  1,500 - 7,800 cells/uL Final  . Lymphs Abs 04/09/2019 3,774  850 - 3,900 cells/uL Final  . Absolute Monocytes 04/09/2019 694  200 - 950 cells/uL Final  . Eosinophils Absolute 04/09/2019 561* 15 - 500 cells/uL Final  . Basophils Absolute 04/09/2019 51  0 - 200 cells/uL Final  . Neutrophils Relative % 04/09/2019 50.2  % Final  . Total Lymphocyte 04/09/2019 37.0  % Final  . Monocytes Relative 04/09/2019 6.8  % Final  . Eosinophils Relative 04/09/2019 5.5  % Final  . Basophils Relative 04/09/2019 0.5  % Final  . Glucose, Bld 04/09/2019 90  65 - 99 mg/dL Final  . BUN 04/09/2019 16  7 - 25 mg/dL Final  . Creat 04/09/2019 1.07* 0.50 - 1.05 mg/dL Final  . BUN/Creatinine  Ratio 04/09/2019 15  6 - 22 (calc) Final  . Sodium 04/09/2019 142  135 - 146 mmol/L Final  . Potassium 04/09/2019 4.7  3.5 - 5.3 mmol/L Final  . Chloride 04/09/2019 106  98 - 110 mmol/L Final  . CO2 04/09/2019 27  20 - 32 mmol/L Final  . Calcium 04/09/2019 9.6  8.6 - 10.4 mg/dL Final  . Total Protein 04/09/2019 6.3  6.1 - 8.1 g/dL Final  . Albumin 04/09/2019 4.3  3.6 - 5.1 g/dL Final  . Globulin 04/09/2019 2.0  1.9 - 3.7 g/dL (calc) Final  . AG Ratio 04/09/2019 2.2  1.0 - 2.5 (calc) Final  . Total Bilirubin 04/09/2019 0.3  0.2 - 1.2 mg/dL Final  . Alkaline phosphatase (APISO) 04/09/2019 57  37 - 153 U/L Final  . AST 04/09/2019 14  10 - 35 U/L Final  . ALT 04/09/2019 15  6 - 29 U/L Final  . Cholesterol 04/09/2019 150  <  200 mg/dL Final  . HDL 04/09/2019 51  > OR = 50 mg/dL Final  . Triglycerides 04/09/2019 99  <150 mg/dL Final  . LDL Cholesterol (Calc) 04/09/2019 81  mg/dL (calc) Final  . Total CHOL/HDL Ratio 04/09/2019 2.9  <5.0 (calc) Final  . Non-HDL Cholesterol (Calc) 04/09/2019 99  <130 mg/dL (calc) Final  . TSH 04/09/2019 2.71  mIU/L Final  . Martin 04/09/2019 31.5  mIU/mL Final      Commons side effects, risks, benefits, and alternatives for medications and treatment plan prescribed today were discussed, and the patient expressed understanding of the given instructions. Patient is instructed to call or message via MyChart if he/she has any questions or concerns regarding our treatment plan. No barriers to understanding were identified. We discussed Red Flag symptoms and signs in detail. Patient expressed understanding regarding what to do in case of urgent or emergency type symptoms.   Medication list was reconciled, printed and provided to the patient in AVS. Patient instructions and summary information was reviewed with the patient as documented in the AVS. This note was prepared with assistance of Dragon voice recognition software. Occasional wrong-word or sound-a-like substitutions  may have occurred due to the inherent limitations of voice recognition software  This visit occurred during the SARS-CoV-2 public health emergency.  Safety protocols were in place, including screening questions prior to the visit, additional usage of staff PPE, and extensive cleaning of exam room while observing appropriate contact time as indicated for disinfecting solutions.

## 2019-05-18 ENCOUNTER — Encounter: Payer: Self-pay | Admitting: Gastroenterology

## 2019-05-24 ENCOUNTER — Other Ambulatory Visit: Payer: Self-pay

## 2019-05-24 ENCOUNTER — Emergency Department (HOSPITAL_COMMUNITY)
Admission: EM | Admit: 2019-05-24 | Discharge: 2019-05-24 | Disposition: A | Discharge status: Home / Self Care | Payer: 59 | Attending: Emergency Medicine | Admitting: Emergency Medicine

## 2019-05-24 ENCOUNTER — Telehealth: Payer: 59 | Admitting: Family

## 2019-05-24 ENCOUNTER — Ambulatory Visit (AMBULATORY_SURGERY_CENTER): Payer: Self-pay | Admitting: *Deleted

## 2019-05-24 ENCOUNTER — Encounter (HOSPITAL_COMMUNITY): Payer: Self-pay | Admitting: *Deleted

## 2019-05-24 VITALS — Ht 64.0 in | Wt 190.0 lb

## 2019-05-24 DIAGNOSIS — F17210 Nicotine dependence, cigarettes, uncomplicated: Secondary | ICD-10-CM | POA: Insufficient documentation

## 2019-05-24 DIAGNOSIS — Z01818 Encounter for other preprocedural examination: Secondary | ICD-10-CM

## 2019-05-24 DIAGNOSIS — H6501 Acute serous otitis media, right ear: Secondary | ICD-10-CM | POA: Diagnosis not present

## 2019-05-24 DIAGNOSIS — Z1211 Encounter for screening for malignant neoplasm of colon: Secondary | ICD-10-CM

## 2019-05-24 DIAGNOSIS — H9201 Otalgia, right ear: Secondary | ICD-10-CM | POA: Diagnosis present

## 2019-05-24 DIAGNOSIS — R42 Dizziness and giddiness: Secondary | ICD-10-CM | POA: Diagnosis not present

## 2019-05-24 DIAGNOSIS — H65191 Other acute nonsuppurative otitis media, right ear: Secondary | ICD-10-CM

## 2019-05-24 MED ORDER — CIPRO HC 0.2-1 % OT SUSP: 3.0000 [drp] | Freq: Two times a day (BID) | OTIC | 0 refills | Status: DC

## 2019-05-24 MED ORDER — SUTAB 1479-225-188 MG PO TABS: 1.0000 | ORAL_TABLET | ORAL | 0 refills | Status: DC

## 2019-05-24 MED ORDER — AMOXICILLIN 500 MG PO CAPS: 1000.0000 mg | ORAL_CAPSULE | Freq: Two times a day (BID) | ORAL | 0 refills | Status: AC

## 2019-05-24 MED ORDER — AMOXICILLIN 500 MG PO CAPS
1000.0000 mg | ORAL_CAPSULE | Freq: Once | ORAL | Status: AC
  Administered 2019-05-24: 22:00:00 1000 mg via ORAL
  Filled 2019-05-24: qty 2

## 2019-05-24 MED ORDER — AMOXICILLIN-POT CLAVULANATE 875-125 MG PO TABS: 1.0000 | ORAL_TABLET | Freq: Two times a day (BID) | ORAL | 0 refills | Status: DC

## 2019-05-24 NOTE — ED Notes (Signed)
Patient verbalizes understanding of discharge instructions. Opportunity for questioning and answers were provided. Armband removed by staff, pt discharged from ED.  

## 2019-05-24 NOTE — Progress Notes (Signed)
Patient is here in-person for PV. Patient denies any allergies to eggs or soy. Patient denies any problems with anesthesia/sedation. Patient denies any oxygen use at home. Patient denies taking any diet/weight loss medications or blood thinners. Patient is not being treated for MRSA or C-diff. Patient is aware of our care-partner policy and 0000000 safety protocol. COVID-19 screening test is on 06/04/19, the pt is aware.   Prep Prescription coupon given to the patient.

## 2019-05-24 NOTE — Discharge Instructions (Signed)
You were treated for a possible ear infection in the ER today.  Many of these infections are caused by viruses, which get better on their own in 7 days.  Some are caused by bacteria, which require antibiotics.  I felt it was reasonable to start you on a 7 day course of antibiotics today.  You told me about issues you've been having with ringing in your left ear, and episodes of vertigo (dizziness with head movement).  I recommended that you see an ENT doctor in the office for this, non-urgently.

## 2019-05-24 NOTE — ED Triage Notes (Signed)
Pt c/o R earache x 3 days. Redness noted.

## 2019-05-24 NOTE — ED Provider Notes (Signed)
[facility] EMERGENCY DEPARTMENT Provider Note   CSN: [id number] Arrival date & time: 05/24/19  2026     History Chief Complaint  Patient presents with  . Otalgia    [name] is a 54 y.o. female with a history of recurrent ear infections dating back to childhood, migraines, presented to emergency department with right-sided ear pain and congestion.  She reports onset approximately 3 days ago.  She said she is having pain in her right ear, particularly with chewing.  She feels like she was having pain along her right upper molars as well.  She denies fevers but reports some subjective chills.  She says she used to have recurrent infections in her ears as a child, but also continued having several episodes as an adult.  She does have grandchildren in the house who had ear infections earlier this week.  She also reports to me that she intermittently gets ringing in her left ear, and she also intermittently has episodes of vertigo, which are positional and worse with standing up or sitting down, with the room spinning intensely around her.  She does not currently have any vertigo.  She does not currently have any hearing loss or ringing in her ears.  HPI     Past Medical History:  Diagnosis Date  . Anxiety   . Arthritis of carpometacarpal (CMC) joint of right thumb   . Bilateral carpal tunnel syndrome   . Breast cyst, right 12/18/2007   3 mm simple cyst at the 11 o'clock position  . Cervical dysplasia   . COPD (chronic obstructive pulmonary disease) ([place])    remote history of early symptoms  . Drug addiction ([place])   . 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 GERD (gastroesophageal reflux disease)    TUMS, OTC treatment as needed  . HSV infection   . Low back pain   . Major depression   . Migraines   . VIN II (vulvar intraepithelial neoplasia II)     Patient Active Problem List   Diagnosis Date Noted  . Urinary incontinence 04/09/2019  .  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 Bilateral carpal tunnel syndrome 06/24/2017  . Primary osteoarthritis of first carpometacarpal joint of right hand 06/24/2017  . Chronic low back pain 06/23/2012  . COPD (chronic obstructive pulmonary disease) ([place]) 11/19/2010  . Drug addiction in remission ([place]) 11/19/2010  . Common migraine 03/23/2010  . GERD (gastroesophageal reflux disease) 03/12/2010  . Obesity (BMI 35.0-39.9 without comorbidity) 03/12/2010  . Tobacco use disorder 12/11/2009  . Major depression, recurrent ([place]) 03/03/2009    Past Surgical History:  Procedure Laterality Date  . CERVICAL CONE BIOPSY  1996   Laser  . LASER ABLATION OF THE CERVIX    . MYRINGOTOMY     Bilateral, age 72 and 54  . TONSILLECTOMY AND ADENOIDECTOMY    . TOTAL VAGINAL HYSTERECTOMY  07/2007   TVH-menorrhagia, dysmenorrhea, still have fallopians tubes and ovaries  . TUBAL LIGATION     Tubal Ligation/Steriization  . VULVECTOMY N/A 09/25/2017   Procedure: PARTIAL VULVECTOMY;  Surgeon: Isabel Caprice, MD;  Location: [facility];  Service: Gynecology;  Laterality: N/A;  . WISDOM TOOTH EXTRACTION       OB History   No obstetric history on file.     Family History  Problem Relation Age of Onset  . Heart failure Mother   . Diabetes Mother   . Breast cancer Paternal Grandmother   .  Tuberculosis Paternal Grandmother   . Stroke Paternal Grandmother   . COPD Maternal Grandmother   . Emphysema Maternal Grandmother   . Arthritis Maternal Grandmother   . Lung cancer Maternal Grandfather   . Heart failure Paternal Grandfather   . High blood pressure Paternal Grandfather   . Colon cancer Neg Hx   . Colon polyps Neg Hx   . Esophageal cancer Neg Hx   . Rectal cancer Neg Hx   . Stomach cancer Neg Hx     Social History   Tobacco Use  . Smoking status: Current Every Day Smoker    Packs/day: 0.50    Years: 40.00    Pack years: 20.00    Types: Cigarettes  .  Smokeless tobacco: Never Used  . Tobacco comment: Patient smokes less than 1/2 pack of cigarettes a day  Substance Use Topics  . Alcohol use: Yes    Comment: occ wine  . Drug use: Not Currently    Types: Marijuana, "Crack" cocaine    Comment: 5 years clean    Home Medications Prior to Admission medications   Medication Sig Start Date End Date Taking? Authorizing Provider  acetaminophen (TYLENOL) 325 MG tablet Take 650 mg by mouth every 6 (six) hours as needed.    [provider]  amoxicillin (AMOXIL) 500 MG capsule Take 2 capsules (1,000 mg total) by mouth 2 (two) times daily for 7 days. 05/25/19 06/01/19  Wyvonnia Dusky, MD  amoxicillin-clavulanate (AUGMENTIN) 875-125 MG tablet Take 1 tablet by mouth 2 (two) times daily. 05/24/19   Evelina Dun A, FNP  ASPIRIN 81 PO Take by mouth.    [provider]  Budeson-Glycopyrrol-Formoterol (BREZTRI AEROSPHERE) 160-9-4.8 MCG/ACT AERO Inhale 2 puffs into the lungs in the morning and at bedtime. 04/09/19   Leamon Arnt, MD  buPROPion (ZYBAN) 150 MG 12 hr tablet Take 1 tablet (150 mg total) by mouth 2 (two) times daily. 05/17/19   Leamon Arnt, MD  ciprofloxacin-hydrocortisone (CIPRO [facility]) OTIC suspension Place 3 drops into the right ear 2 (two) times daily. 05/24/19   Evelina Dun A, FNP  gabapentin (NEURONTIN) 100 MG capsule Take 1-3 capsules (100-300 mg total) by mouth at bedtime. 04/09/19   Leamon Arnt, MD  ibuprofen (ADVIL,MOTRIN) 200 MG tablet Take 200 mg by mouth as needed.    [provider]  Sodium Sulfate-Mag Sulfate-KCl (SUTAB) [phone] MG TABS Take 1 kit by mouth as directed. 05/24/19   Thornton Park, MD    Allergies    Metronidazole and Latex  Review of Systems   Review of Systems  Constitutional: Positive for chills and fever.  HENT: Positive for ear pain. Negative for ear discharge, hearing loss and rhinorrhea.   Eyes: Negative for pain and visual disturbance.  Respiratory: Negative for  cough and shortness of breath.   Cardiovascular: Negative for chest pain and palpitations.  Skin: Negative for color change and rash.  Neurological: Positive for headaches. Negative for syncope.  Psychiatric/Behavioral: Negative for agitation and confusion.  All other systems reviewed and are negative.   Physical Exam Updated Vital Signs BP 133/73   Pulse 68   Temp 98.2 F (36.8 C) (Oral)   Resp 20   Ht 5' 4"  (1.626 m)   Wt 86.2 kg   SpO2 99%   BMI 32.61 kg/m   Physical Exam Vitals and nursing note reviewed.  Constitutional:      General: She is not in acute distress.    Appearance: She is well-developed.  HENT:     Head: Normocephalic and atraumatic.     Comments: Hearing intact and equal bilaterally Right sided external ear and auditory canal unremarkable, no erythema or tenderness of drainage Right TM with serous effusion, no perforation No mastoid tenderness Left external ear, TM unremarkable No fluctuant abscess in oral cavity, no broken teeth, no purulent drainage, fillings to right upper posterior molars Eyes:     Extraocular Movements: Extraocular movements intact.     Conjunctiva/sclera: Conjunctivae normal.     Pupils: Pupils are equal, round, and reactive to light.  Cardiovascular:     Rate and Rhythm: Normal rate and regular rhythm.  Pulmonary:     Effort: Pulmonary effort is normal. No respiratory distress.  Musculoskeletal:     Cervical back: Neck supple.  Skin:    General: Skin is warm and dry.  Neurological:     Mental Status: She is alert.  Psychiatric:        Mood and Affect: Mood normal.        Behavior: Behavior normal.     ED Results / Procedures / Treatments   Labs (all labs ordered are listed, but only abnormal results are displayed) Labs Reviewed - No data to display  EKG None  Radiology No results found.  Procedures Procedures (including critical care time)  Medications Ordered in ED Medications  amoxicillin (AMOXIL)  capsule 1,000 mg (1,000 mg Oral Given 05/24/19 2229)    ED Course  I have reviewed the triage vital signs and the nursing notes.  Pertinent labs & imaging results that were available during my care of the patient were reviewed by me and considered in my medical decision making (see chart for details).  54 yo female presenting to the Ed with right-sided ear pain for 3 days.  She has multiple grandchildren who had an ear infection recently.  I suspect this is likely a virus otitis media.  She does have an effusion on the right middle ear, which is likely communicating with her oral cavity.  There is no evidence of a dental infection or dental abscess on my exam.  I doubt this is a dental abscess or deep space infection.  Likewise no hearing loss or persistent ringing in her ears, of lower suspicion for Mnire's disease at this time.  It is possible she suffers from BPPV you have these occasional positional episodes of vertigo.  She may benefit from seeing ENT for this, but I explained this was a nonurgent follow-up.  For now we discussed watchful waiting versus antibiotics for possible bacterial infection.  With the fevers and chills she feels she has been having, I think is reasonable to try a course of amoxicillin.    No evidence of malignant status of Turner or mastoiditis my exam.    Final Clinical Impression(s) / ED Diagnoses Final diagnoses:  Non-recurrent acute serous otitis media of right ear  Vertigo    Rx / DC Orders ED Discharge Orders         Ordered    amoxicillin (AMOXIL) 500 MG capsule  2 times daily     05/24/19 2222           Wyvonnia Dusky, MD 05/24/19 2334

## 2019-05-24 NOTE — Progress Notes (Signed)
E Visit for Swimmer's Ear  We are sorry that you are not feeling well. Here is how we plan to help!  Based on what you have shared with me it looks like you have swimmers ear. Swimmer's ear is a redness or swelling, irritation, or infection of your outer ear canal.  These symptoms usually occur within a few days of swimming.  Your ear canal is a tube that goes from the opening of the ear to the eardrum.  When water stays in your ear canal, germs can grow.  This is a painful condition that often happens to children and swimmers of all ages.  It is not contagious and oral antibiotics are not required to treat uncomplicated swimmer's ear.  The usual symptoms include: Itching inside the ear, Redness or a sense of swelling in the ear, Pain when the ear is tugged on when pressure is placed on the ear, Pus draining from the infected ear. and I have prescribed: Ciprofloxin 0.2% and hydrocortisone 1% otic suspension 3 drops in affected ears twice daily for 7 days  Augmentin 875mg  one tablet by mouth twice a day for 10 days  In certain cases swimmer's ear may progress to a more serious bacterial infection of the middle or inner ear.  If you have a fever 102 and up and significantly worsening symptoms, this could indicate a more serious infection moving to the middle/inner and needs face to face evaluation in an office by a provider.  Your symptoms should improve over the next 3 days and should resolve in about 7 days.  HOME CARE:   Wash your hands frequently.  Do not place the tip of the bottle on your ear or touch it with your fingers.  You can take Acetominophen 650 mg every 4-6 hours as needed for pain.  If pain is severe or moderate, you can apply a heating pad (set on low) or hot water bottle (wrapped in a towel) to outer ear for 20 minutes.  This will also increase drainage.  Avoid ear plugs  Do not use Q-tips  After showers, help the water run out by tilting your head to one side.  GET HELP  RIGHT AWAY IF:   Fever is over 102.2 degrees.  You develop progressive ear pain or hearing loss.  Ear symptoms persist longer than 3 days after treatment.  MAKE SURE YOU:   Understand these instructions.  Will watch your condition.  Will get help right away if you are not doing well or get worse.  TO PREVENT SWIMMER'S EAR:  Use a bathing cap or custom fitted swim molds to keep your ears dry.  Towel off after swimming to dry your ears.  Tilt your head or pull your earlobes to allow the water to escape your ear canal.  If there is still water in your ears, consider using a hairdryer on the lowest setting.  Thank you for choosing an e-visit. Your e-visit answers were reviewed by a board certified advanced clinical practitioner to complete your personal care plan. Depending upon the condition, your plan could have included both over the counter or prescription medications. Please review your pharmacy choice. Be sure that the pharmacy you have chosen is open so that you can pick up your prescription now.  If there is a problem you may message your provider in [place] to have the prescription routed to another pharmacy. Your safety is important to [place]. If you have drug allergies check your prescription carefully.  For the  next 24 hours, you can use MyChart to ask questions about today's visit, request a non-urgent call back, or ask for a work or school excuse from your e-visit provider. You will get an email in the next two days asking about your experience. I hope that your e-visit has been valuable and will speed your recovery.    Approximately 5 minutes was spent documenting and reviewing patient's chart.

## 2019-05-31 ENCOUNTER — Encounter: Payer: Self-pay | Admitting: Gastroenterology

## 2019-06-04 ENCOUNTER — Ambulatory Visit (INDEPENDENT_AMBULATORY_CARE_PROVIDER_SITE_OTHER): Payer: 59

## 2019-06-04 ENCOUNTER — Other Ambulatory Visit: Payer: Self-pay | Admitting: Gastroenterology

## 2019-06-04 DIAGNOSIS — Z1159 Encounter for screening for other viral diseases: Secondary | ICD-10-CM

## 2019-06-05 LAB — SARS CORONAVIRUS 2 (TAT 6-24 HRS): SARS Coronavirus 2: NEGATIVE

## 2019-06-07 ENCOUNTER — Telehealth: Payer: Self-pay

## 2019-06-07 NOTE — Telephone Encounter (Signed)
Left message for pt to call back to see if her appt could be moved up earlier in the day as Dr. Tarri Glenn had some cancellations.

## 2019-06-08 ENCOUNTER — Other Ambulatory Visit: Payer: Self-pay

## 2019-06-08 ENCOUNTER — Encounter: Payer: Self-pay | Admitting: Gastroenterology

## 2019-06-08 ENCOUNTER — Ambulatory Visit (AMBULATORY_SURGERY_CENTER): Payer: 59 | Admitting: Gastroenterology

## 2019-06-08 VITALS — BP 117/73 | HR 70 | Temp 98.0°F | Resp 14 | Ht 64.0 in | Wt 190.0 lb

## 2019-06-08 DIAGNOSIS — Z1211 Encounter for screening for malignant neoplasm of colon: Secondary | ICD-10-CM

## 2019-06-08 DIAGNOSIS — D124 Benign neoplasm of descending colon: Secondary | ICD-10-CM | POA: Diagnosis not present

## 2019-06-08 MED ORDER — SODIUM CHLORIDE 0.9 % IV SOLN: 500.0000 mL | Freq: Once | INTRAVENOUS | Status: DC

## 2019-06-08 NOTE — Progress Notes (Signed)
A and O x3. Report to RN. Tolerated MAC anesthesia well.

## 2019-06-08 NOTE — Progress Notes (Signed)
Called to room to assist during endoscopic procedure.  Patient ID and intended procedure confirmed with present staff. Received instructions for my participation in the procedure from the performing physician.  

## 2019-06-08 NOTE — Patient Instructions (Signed)
Handouts on polyps ,diverticulosis ,& hemorrhoids & high fiber diet given to you today  Await pathology results on polyps removed   See procedure report for psyllium and high fiber recommendations and colon health fruits,vegetables,etc diet    YOU HAD AN ENDOSCOPIC PROCEDURE TODAY AT [place]:   Refer to the procedure report that was given to you for any specific questions about what was found during the examination.  If the procedure report does not answer your questions, please call your gastroenterologist to clarify.  If you requested that your care partner not be given the details of your procedure findings, then the procedure report has been included in a sealed envelope for you to review at your convenience later.  YOU SHOULD EXPECT: Some feelings of bloating in the abdomen. Passage of more gas than usual.  Walking can help get rid of the air that was put into your GI tract during the procedure and reduce the bloating. If you had a lower endoscopy (such as a colonoscopy or flexible sigmoidoscopy) you may notice spotting of blood in your stool or on the toilet paper. If you underwent a bowel prep for your procedure, you may not have a normal bowel movement for a few days.  Please Note:  You might notice some irritation and congestion in your nose or some drainage.  This is from the oxygen used during your procedure.  There is no need for concern and it should clear up in a day or so.  SYMPTOMS TO REPORT IMMEDIATELY:   Following lower endoscopy (colonoscopy or flexible sigmoidoscopy):  Excessive amounts of blood in the stool  Significant tenderness or worsening of abdominal pains  Swelling of the abdomen that is new, acute  Fever of 100F or higher    For urgent or emergent issues, a gastroenterologist can be reached at any hour by calling [phone]. Do not use MyChart messaging for urgent concerns.    DIET:  We do recommend a small meal at first, but then  you may proceed to your regular diet.  Drink plenty of fluids but you should avoid alcoholic beverages for 24 hours.  ACTIVITY:  You should plan to take it easy for the rest of today and you should NOT DRIVE or use heavy machinery until tomorrow (because of the sedation medicines used during the test).    FOLLOW UP: Our staff will call the number listed on your records 48-72 hours following your procedure to check on you and address any questions or concerns that you may have regarding the information given to you following your procedure. If we do not reach you, we will leave a message.  We will attempt to reach you two times.  During this call, we will ask if you have developed any symptoms of COVID 19. If you develop any symptoms (ie: fever, flu-like symptoms, shortness of breath, cough etc.) before then, please call [phone].  If you test positive for Covid 19 in the 2 weeks post procedure, please call and report this information to [place].    If any biopsies were taken you will be contacted by phone or by letter within the next 1-3 weeks.  Please call us at [phone] if you have not heard about the biopsies in 3 weeks.    SIGNATURES/CONFIDENTIALITY: You and/or your care partner have signed paperwork which will be entered into your electronic medical record.  These signatures attest to the fact that that the information above on your After  Visit Summary has been reviewed and is understood.  Full responsibility of the confidentiality of this discharge information lies with you and/or your care-partner.

## 2019-06-08 NOTE — Op Note (Signed)
[facility] Patient Name: [name] Procedure Date: 06/08/2019 2:48 PM MRN: [id number] Endoscopist: Thornton Park MD, MD Age: 54 Referring MD:  Date of Birth: 07/19/1965 Gender: Female Account #: [id number] Procedure:                Colonoscopy Indications:              Screening for colorectal malignant neoplasm, This                            is the patient's first colonoscopy                           No known family history of colon cancer or polyps Medicines:                Monitored Anesthesia Care Procedure:                Pre-Anesthesia Assessment:                           - Prior to the procedure, a History and Physical                            was performed, and patient medications and                            allergies were reviewed. The patient's tolerance of                            previous anesthesia was also reviewed. The risks                            and benefits of the procedure and the sedation                            options and risks were discussed with the patient.                            All questions were answered, and informed consent                            was obtained. Prior Anticoagulants: The patient has                            taken no previous anticoagulant or antiplatelet                            agents. ASA Grade Assessment: II - A patient with                            mild systemic disease. After reviewing the risks                            and benefits, the patient was deemed in  satisfactory condition to undergo the procedure.                           After obtaining informed consent, the colonoscope                            was passed under direct vision. Throughout the                            procedure, the patient's blood pressure, pulse, and                            oxygen saturations were monitored continuously. The                            Colonoscope was  introduced through the anus and                            advanced to the 3 cm into the ileum. A second                            forward view of the right colon was performed. The                            colonoscopy was performed without difficulty. The                            patient tolerated the procedure well. The quality                            of the bowel preparation was good. The terminal                            ileum, ileocecal valve, appendiceal orifice, and                            rectum were photographed. Scope In: 2:52:34 PM Scope Out: 3:10:01 PM Scope Withdrawal Time: 0 hours 14 minutes 46 seconds  Total Procedure Duration: 0 hours 17 minutes 27 seconds  Findings:                 The perianal and digital rectal examinations were                            normal except for external hemorrhoids.                           Multiple small-mouthed diverticula were found in                            the sigmoid colon and descending colon. Estimated                            blood loss was minimal.  Two flat polyps were found in the descending colon.                            The polyps were 1-2 mm in size. These polyps were                            removed with a cold snare. Resection and retrieval                            were complete. Estimated blood loss was minimal.                           Internal hemorrhoids seen. The exam was otherwise                            without abnormality on direct and retroflexion                            views. Complications:            No immediate complications. Estimated blood loss:                            Minimal. Estimated Blood Loss:     Estimated blood loss was minimal. Impression:               - Internal and external hemorrhoids.                           - Diverticulosis in the sigmoid colon and in the                            descending colon.                           -  Two 1-2 mm polyps in the descending colon,                            removed with a cold snare. Resected and retrieved.                           - The examination was otherwise normal on direct                            and retroflexion views. Recommendation:           - Patient has a contact number available for                            emergencies. The signs and symptoms of potential                            delayed complications were discussed with the                            patient.  Return to normal activities tomorrow.                            Written discharge instructions were provided to the                            patient.                           - Follow a high fiber diet. Drink at least 64                            ounces of water daily. Add a daily stool bulking                            agent such as psyllium (an exampled would be                            Metamucil).                           - Continue present medications.                           - Await pathology results.                           - Repeat colonoscopy date to be determined after                            pending pathology results are reviewed for                            surveillance.                           - Emerging evidence supports eating a diet of                            fruits, vegetables, grains, calcium, and yogurt                            while reducing red meat and alcohol may reduce the                            risk of colon cancer.                           - Thank you for allowing me to be involved in your                            colon cancer prevention. Thornton Park MD, MD 06/08/2019 3:17:24 PM This report has been signed electronically.

## 2019-06-08 NOTE — Telephone Encounter (Signed)
Pt did not call back

## 2019-06-08 NOTE — Progress Notes (Signed)
Pt's states no medical or surgical changes since previsit or office visit. VS by CW. 

## 2019-06-10 ENCOUNTER — Telehealth: Payer: Self-pay

## 2019-06-10 ENCOUNTER — Telehealth: Payer: Self-pay | Admitting: *Deleted

## 2019-06-10 NOTE — Telephone Encounter (Signed)
Left message on follow up call. 

## 2019-06-10 NOTE — Telephone Encounter (Signed)
  Follow up Call-  Call back number 06/08/2019  Post procedure Call Back phone  # [phone]  Permission to leave phone message Yes  Some recent data might be hidden     Patient questions:  Do you have a fever, pain , or abdominal swelling? No. Pain Score  0 *  Have you tolerated food without any problems? Yes.    Have you been able to return to your normal activities? Yes.    Do you have any questions about your discharge instructions: Diet   No. Medications  No. Follow up visit  No.  Do you have questions or concerns about your Care? No.  Actions: * If pain score is 4 or above: 1. No action needed, pain <4.Have you developed a fever since your procedure? no  2.   Have you had an respiratory symptoms (SOB or cough) since your procedure? no  3.   Have you tested positive for COVID 19 since your procedure no  4.   Have you had any family members/close contacts diagnosed with the COVID 19 since your procedure?  no   If yes to any of these questions please route to Joylene John, RN and Erenest Rasher, RN

## 2019-06-15 ENCOUNTER — Encounter: Payer: Self-pay | Admitting: Gastroenterology

## 2019-06-16 ENCOUNTER — Encounter: Payer: Self-pay | Admitting: Family Medicine

## 2019-06-16 DIAGNOSIS — D126 Benign neoplasm of colon, unspecified: Secondary | ICD-10-CM

## 2019-06-16 HISTORY — DX: Benign neoplasm of colon, unspecified: D12.6

## 2019-06-16 NOTE — Progress Notes (Signed)
Reviewed report/notes and updated pt's chart/history/PL and/or HM accordingly. 

## 2019-07-12 ENCOUNTER — Encounter: Payer: 59 | Admitting: Family Medicine

## 2019-07-24 ENCOUNTER — Other Ambulatory Visit: Payer: Self-pay

## 2019-07-24 ENCOUNTER — Encounter (HOSPITAL_COMMUNITY): Payer: Self-pay

## 2019-07-24 ENCOUNTER — Ambulatory Visit (HOSPITAL_COMMUNITY)
Admission: EM | Admit: 2019-07-24 | Discharge: 2019-07-24 | Disposition: A | Discharge status: Home / Self Care | Payer: 59

## 2019-07-24 DIAGNOSIS — R55 Syncope and collapse: Secondary | ICD-10-CM

## 2019-07-24 DIAGNOSIS — R42 Dizziness and giddiness: Secondary | ICD-10-CM | POA: Diagnosis not present

## 2019-07-24 DIAGNOSIS — R002 Palpitations: Secondary | ICD-10-CM | POA: Diagnosis not present

## 2019-07-24 MED ORDER — MECLIZINE HCL 12.5 MG PO TABS
12.5000 mg | ORAL_TABLET | Freq: Three times a day (TID) | ORAL | 0 refills | Status: DC | PRN
Start: 2019-07-24 — End: 2019-08-17

## 2019-07-24 NOTE — Discharge Instructions (Addendum)
Your EKG was normal in the office today  I have sent in meclizine to your pharmacy  Follow-up with Dr. Janeice Robinson office  Follow-up with the ER if symptoms are getting much much worse, you are having trouble breathing, trouble swallowing, other concerning symptoms

## 2019-07-24 NOTE — ED Provider Notes (Signed)
[place]   [id number] 07/24/19 Arrival Time: 1024  CC: DIZZINESS  SUBJECTIVE:  [name] is a 54 y.o. female who presents with complaint of dizziness that yesterday while she was at the dentist.  Reports that she was lying down and then when she sat up, she had an episode of dizziness, sweating, heart racing, thought she was going to pass out.  Reports history of vertigo that is usually helped with Dramamine.  Reports that her dizziness is worse than it has ever been.  Reports heart palpitations.  Reports that Dramamine is just making her tired but not helping the dizziness.  Reports that she feels like she is spinning.  Denies fever, chills, nausea, vomiting, hearing changes, tinnitus, ear pain, chest pain, syncope, SOB, weakness, slurred speech, memory or emotional changes, facial drooping/ asymmetry, incoordination, numbness or tingling, abdominal pain, changes in bowel or bladder habits.     ROS: As per HPI.  All other pertinent ROS negative.    Past Medical History:  Diagnosis Date  . Anxiety   . Arthritis of carpometacarpal (CMC) joint of right thumb   . Bilateral carpal tunnel syndrome   . Breast cyst, right 12/18/2007   3 mm simple cyst at the 11 o'clock position  . Cervical dysplasia   . COPD (chronic obstructive pulmonary disease) ([place])    remote history of early symptoms  . Drug addiction ([place])   . 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 GERD (gastroesophageal reflux disease)    TUMS, OTC treatment as needed  . HSV infection   . Low back pain   . Major depression   . Migraines   . Tubular adenoma of colon 06/16/2019   Colonoscopy 05/2019, Dr. Tarri Glenn, repeat in 7 years  . VIN II (vulvar intraepithelial neoplasia II)    Past Surgical History:  Procedure Laterality Date  . CERVICAL CONE BIOPSY  1996   Laser  . LASER ABLATION OF THE CERVIX    . MYRINGOTOMY     Bilateral, age 51 and 59  . TONSILLECTOMY AND ADENOIDECTOMY    . TOTAL VAGINAL  HYSTERECTOMY  07/2007   TVH-menorrhagia, dysmenorrhea, still have fallopians tubes and ovaries  . TUBAL LIGATION     Tubal Ligation/Steriization  . VULVECTOMY N/A 09/25/2017   Procedure: PARTIAL VULVECTOMY;  Surgeon: Isabel Caprice, MD;  Location: [facility];  Service: Gynecology;  Laterality: N/A;  . WISDOM TOOTH EXTRACTION     Allergies  Allergen Reactions  . Metronidazole Itching  . Latex Rash   No current facility-administered medications on file prior to encounter.   Current Outpatient Medications on File Prior to Encounter  Medication Sig Dispense Refill  . Budeson-Glycopyrrol-Formoterol (BREZTRI AEROSPHERE) 160-9-4.8 MCG/ACT AERO Inhale 2 puffs into the lungs in the morning and at bedtime. 10.7 g 5  . gabapentin (NEURONTIN) 100 MG capsule Take 1-3 capsules (100-300 mg total) by mouth at bedtime. 90 capsule 3  . acetaminophen (TYLENOL) 325 MG tablet Take 650 mg by mouth every 6 (six) hours as needed.    Marland Kitchen amoxicillin-clavulanate (AUGMENTIN) 875-125 MG tablet Take 1 tablet by mouth 2 (two) times daily.    . ASPIRIN 81 PO Take by mouth.    Marland Kitchen buPROPion (ZYBAN) 150 MG 12 hr tablet Take 1 tablet (150 mg total) by mouth 2 (two) times daily. (Patient not taking: Reported on 06/08/2019) 180 tablet 3  . ciprofloxacin-hydrocortisone (CIPRO HC) OTIC suspension Place 3 drops into the right ear 2 (two)  times daily. (Patient not taking: Reported on 06/08/2019) 10 mL 0  . ibuprofen (ADVIL,MOTRIN) 200 MG tablet Take 200 mg by mouth as needed.     Social History   Socioeconomic History  . Marital status: Single    Spouse name: Not on file  . Number of children: Not on file  . Years of education: Not on file  . Highest education level: Not on file  Occupational History  . Occupation: Radiation protection practitioner  Tobacco Use  . Smoking status: Current Every Day Smoker    Packs/day: 0.50    Years: 40.00    Pack years: 20.00    Types: Cigarettes  . Smokeless tobacco:  Never Used  . Tobacco comment: Patient smokes less than 1/2 pack of cigarettes a day  Vaping Use  . Vaping Use: Never used  Substance and Sexual Activity  . Alcohol use: Yes    Comment: occ wine  . 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 Sexual activity: Yes    Partners: Male    Birth control/protection: Surgical  Other Topics Concern  . Not on file  Social History Narrative  . Not on file   Social Determinants of Health   Financial Resource Strain:   . Difficulty of Paying Living Expenses:   Food Insecurity:   . Worried About Charity fundraiser in the Last Year:   . Arboriculturist in the Last Year:   Transportation Needs:   . Film/video editor (Medical):   Marland Kitchen Lack of Transportation (Non-Medical):   Physical Activity:   . Days of Exercise per Week:   . Minutes of Exercise per Session:   Stress:   . Feeling of Stress :   Social Connections:   . Frequency of Communication with Friends and Family:   . Frequency of Social Gatherings with Friends and Family:   . Attends Religious Services:   . Active Member of Clubs or Organizations:   . Attends Archivist Meetings:   Marland Kitchen Marital Status:   Intimate Partner Violence:   . Fear of Current or Ex-Partner:   . Emotionally Abused:   Marland Kitchen Physically Abused:   . Sexually Abused:    Family History  Problem Relation Age of Onset  . Heart failure Mother   . Diabetes Mother   . Breast cancer Paternal Grandmother   . Tuberculosis Paternal Grandmother   . Stroke Paternal Grandmother   . COPD Maternal Grandmother   . Emphysema Maternal Grandmother   . Arthritis Maternal Grandmother   . Lung cancer Maternal Grandfather   . Heart failure Paternal Grandfather   . High blood pressure Paternal Grandfather   . Colon cancer Neg Hx   . Colon polyps Neg Hx   . Esophageal cancer Neg Hx   . Rectal cancer Neg Hx   . Stomach cancer Neg Hx     OBJECTIVE:  Vitals:    07/24/19 1123  BP: 118/64  Pulse: 72  Resp: 16  Temp: 98.2 F (36.8 C)  TempSrc: Oral  SpO2: 100%    General appearance: alert; no distress Eyes: PERRLA; EOMI; conjunctiva normal HENT: normocephalic; atraumatic; TMs normal; nasal mucosa normal; oral mucosa normal Neck: supple with FROM Lungs: clear to auscultation bilaterally Heart: regular rate and rhythm Abdomen: soft, non-tender; bowel sounds normal Extremities: no cyanosis or edema; symmetrical with no gross deformities Skin: warm and dry Neurologic: normal gait; normal symmetric  reflexes; CN 2-12 grossly intact Psychological: alert and cooperative; normal mood and affect  Labs:  No results found for this or any previous visit (from the past 24 hour(s)). Orders placed or performed during the hospital encounter of 07/24/19  . ED EKG  . ED EKG    No results found.  ASSESSMENT & PLAN:  1. Dizziness   2. Palpitations   3. Near syncope     Meds ordered this encounter  Medications  . meclizine (ANTIVERT) 12.5 MG tablet    Sig: Take 1 tablet (12.5 mg total) by mouth 3 (three) times daily as needed for dizziness.    Dispense:  30 tablet    Refill:  0    Order Specific Question:   Supervising Provider    Answer:   Chase Picket [id number]   EKG normal sinus rhythm in office today Orthostatic blood pressure negative in office today Prescribed meclizine Has referral for Dr. Constance Holster, instructed patient to go ahead and set an appointment to be seen Follow-up with this office or primary care as needed Reviewed expectations re: course of current medical issues. Questions answered. Outlined signs and symptoms indicating need for more acute intervention. Patient verbalized understanding. After Visit Summary given.     Faustino Congress, NP 07/24/19 1231

## 2019-07-24 NOTE — ED Triage Notes (Signed)
Pt presents to UC for vertigo. Pt states she has been dizzy x2 days. Pt has been treating with dramamine with out relief. Pt states this is the worst episode she has had. Pt states she has been referred to ENT for further eval but has not been to appt. Pt ambulated to treatment space with assistance. Pt denies visual disturbances at this time.

## 2019-07-25 ENCOUNTER — Encounter (INDEPENDENT_AMBULATORY_CARE_PROVIDER_SITE_OTHER): Payer: Self-pay

## 2019-07-30 ENCOUNTER — Other Ambulatory Visit: Payer: Self-pay

## 2019-07-30 ENCOUNTER — Encounter: Payer: Self-pay | Admitting: Family Medicine

## 2019-07-30 DIAGNOSIS — R42 Dizziness and giddiness: Secondary | ICD-10-CM

## 2019-07-30 NOTE — Telephone Encounter (Signed)
Please call: sxs can last awhile unfortunately.  Use meclizine as needed. Can refer for vestibular rehab (PT). thanks

## 2019-08-16 ENCOUNTER — Other Ambulatory Visit: Payer: Self-pay

## 2019-08-16 DIAGNOSIS — R42 Dizziness and giddiness: Secondary | ICD-10-CM

## 2019-08-17 ENCOUNTER — Other Ambulatory Visit: Payer: Self-pay

## 2019-08-17 MED ORDER — MECLIZINE HCL 12.5 MG PO TABS: 12.5000 mg | ORAL_TABLET | Freq: Three times a day (TID) | ORAL | 0 refills | Status: DC | PRN

## 2019-08-20 ENCOUNTER — Other Ambulatory Visit: Payer: Self-pay

## 2019-08-20 DIAGNOSIS — R42 Dizziness and giddiness: Secondary | ICD-10-CM

## 2019-08-23 ENCOUNTER — Other Ambulatory Visit: Payer: Self-pay

## 2019-08-23 DIAGNOSIS — R42 Dizziness and giddiness: Secondary | ICD-10-CM

## 2019-08-27 ENCOUNTER — Encounter: Payer: 59 | Admitting: Family Medicine

## 2019-09-09 ENCOUNTER — Ambulatory Visit (INDEPENDENT_AMBULATORY_CARE_PROVIDER_SITE_OTHER): Payer: 59 | Admitting: Rehabilitative and Restorative Service Providers"

## 2019-09-09 ENCOUNTER — Other Ambulatory Visit: Payer: Self-pay

## 2019-09-09 ENCOUNTER — Ambulatory Visit (INDEPENDENT_AMBULATORY_CARE_PROVIDER_SITE_OTHER): Payer: 59 | Admitting: Family Medicine

## 2019-09-09 ENCOUNTER — Encounter: Payer: Self-pay | Admitting: Rehabilitative and Restorative Service Providers"

## 2019-09-09 ENCOUNTER — Encounter: Payer: Self-pay | Admitting: Family Medicine

## 2019-09-09 VITALS — BP 121/72 | HR 86 | Temp 98.6°F | Ht 64.0 in | Wt 188.2 lb

## 2019-09-09 DIAGNOSIS — M545 Low back pain: Secondary | ICD-10-CM

## 2019-09-09 DIAGNOSIS — R42 Dizziness and giddiness: Secondary | ICD-10-CM

## 2019-09-09 DIAGNOSIS — N951 Menopausal and female climacteric states: Secondary | ICD-10-CM

## 2019-09-09 DIAGNOSIS — J42 Unspecified chronic bronchitis: Secondary | ICD-10-CM | POA: Diagnosis not present

## 2019-09-09 DIAGNOSIS — M47812 Spondylosis without myelopathy or radiculopathy, cervical region: Secondary | ICD-10-CM | POA: Insufficient documentation

## 2019-09-09 DIAGNOSIS — Z1159 Encounter for screening for other viral diseases: Secondary | ICD-10-CM | POA: Diagnosis not present

## 2019-09-09 DIAGNOSIS — H8112 Benign paroxysmal vertigo, left ear: Secondary | ICD-10-CM | POA: Diagnosis not present

## 2019-09-09 DIAGNOSIS — Z0000 Encounter for general adult medical examination without abnormal findings: Secondary | ICD-10-CM

## 2019-09-09 DIAGNOSIS — G8929 Other chronic pain: Secondary | ICD-10-CM

## 2019-09-09 DIAGNOSIS — F17200 Nicotine dependence, unspecified, uncomplicated: Secondary | ICD-10-CM

## 2019-09-09 DIAGNOSIS — F330 Major depressive disorder, recurrent, mild: Secondary | ICD-10-CM

## 2019-09-09 MED ORDER — ESCITALOPRAM OXALATE 10 MG PO TABS: 10.0000 mg | ORAL_TABLET | Freq: Every day | ORAL | 2 refills | Status: DC

## 2019-09-09 MED ORDER — DICLOFENAC SODIUM 75 MG PO TBEC: 75.0000 mg | DELAYED_RELEASE_TABLET | Freq: Two times a day (BID) | ORAL | 0 refills | Status: DC

## 2019-09-09 MED ORDER — CYCLOBENZAPRINE HCL 10 MG PO TABS: 10.0000 mg | ORAL_TABLET | Freq: Every evening | ORAL | 0 refills | Status: DC | PRN

## 2019-09-09 NOTE — Patient Instructions (Signed)
Please return in 6 months for recheck.   Take the voltaren twice daily for the next two weeks with food. It should help your neck pain. We can refill for as needed use if it does. I've sent in a muscle relaxer for nighttime use as well. It can also help with sleep.   Start lexapro for hot flushes and anxiety. It can take several weeks before you notice improvements  I will release your lab results to you on your MyChart account with further instructions. Please reply with any questions.    If you have any questions or concerns, please don't hesitate to send me a message via MyChart or call the office at [phone]. Thank you for visiting with [place] today! It's our pleasure caring for you.  Please do these things to maintain good health!   Exercise at least 30-45 minutes a day,  4-5 days a week.   Eat a low-fat diet with lots of fruits and vegetables, up to 7-9 servings per day.  Drink plenty of water daily. Try to drink 8 8oz glasses per day.  Seatbelts can save your life. Always wear your seatbelt.  Place Smoke Detectors on every level of your home and check batteries every year.  Schedule an appointment with an eye doctor for an eye exam every 1-2 years  Safe sex - use condoms to protect yourself from STDs if you could be exposed to these types of infections. Use birth control if you do not want to become pregnant and are sexually active.  Avoid heavy alcohol use. If you drink, keep it to less than 2 drinks/day and not every day.  [place].  Choose someone you trust that could speak for you if you became unable to speak for yourself.  Depression is common in our stressful world.If you're feeling down or losing interest in things you normally enjoy, please come in for a visit.  If anyone is threatening or hurting you, please get help. Physical or Emotional Violence is never OK.

## 2019-09-09 NOTE — Patient Instructions (Signed)
Access Code: [facility] URL: [email or web address] Date: 09/09/2019 Prepared by: Rudell Cobb  Exercises Seated Gaze Stabilization with Head Rotation - 2 x daily - 7 x weekly - 1 sets - 1 reps - 30 seconds hold

## 2019-09-09 NOTE — Progress Notes (Signed)
Subjective  Chief Complaint  Patient presents with  . Annual Exam  . Menopause    pt has question     HPI: [name] is a 54 y.o. female who presents to [facility] [place] today for a Female Wellness Visit. She also has the concerns and/or needs as listed above in the chief complaint. These will be addressed in addition to the Health Maintenance Visit.   Wellness Visit: annual visit with health maintenance review and exam without Pap   HM: screens are up to date. Hep c screen indicated. Smoker.  Chronic disease f/u and/or acute problem visit: (deemed necessary to be done in addition to the wellness visit):  Copd: on Breztri and doing well. Remains precontemplative about smoking cessation mainly due to stressors. But still working on "getting there" with her mindset  Menopausal sxs including hot flashes and increased irritablity. Gabapentin has helped. But still with hot flushes during the day. Has h/o depression : has been on zoloft and paxil. Not depressed but more anxious   Neck pain persists: reviewed xrays showing facet arthritis. No radicular sxs. Some stiffness.   Chronic low back pain. No change.   Vertigo: seeing PT for rehab.   Assessment  1. Annual physical exam   2. Chronic bronchitis, unspecified chronic bronchitis type ([place])   3. Menopausal symptoms   4. Chronic midline low back pain without sciatica   5. Tobacco use disorder   6. Need for hepatitis C screening test   7. Facet arthritis of cervical region   8. Vertigo   9. Mild episode of recurrent major depressive disorder [facility])      Plan  Female Wellness Visit:  Age appropriate Health Maintenance and Prevention measures were discussed with patient. Included topics are cancer screening recommendations, ways to keep healthy (see AVS) including dietary and exercise recommendations, regular eye and dental care, use of seat belts, and avoidance of moderate alcohol use and tobacco use.     BMI: discussed patient's BMI and encouraged positive lifestyle m lipids where appropriate.  Discussed recommendations regarding Vit D and calcium supplementation (see AVS)  Chronic disease management visit and/or acute problem visit:  Copd is improved. Smoking cessation mentioned.   Menopause and anxiety: start lexparo and continue gabapentin. counselding and education given  Neck and back pain due tdjd: nsaid and mm relaxer started  neruo rehab for PT  Follow up: 6 months for recheck  Orders Placed This Encounter  Procedures  . CBC with Differential/Platelet  . Comprehensive metabolic panel  . Lipid panel  . Hepatitis C antibody  . HIV Antibody (routine testing w rflx)   Meds ordered this encounter  Medications  . diclofenac (VOLTAREN) 75 MG EC tablet    Sig: Take 1 tablet (75 mg total) by mouth 2 (two) times daily.    Dispense:  30 tablet    Refill:  0  . cyclobenzaprine (FLEXERIL) 10 MG tablet    Sig: Take 1 tablet (10 mg total) by mouth at bedtime as needed for muscle spasms.    Dispense:  30 tablet    Refill:  0  . escitalopram (LEXAPRO) 10 MG tablet    Sig: Take 1 tablet (10 mg total) by mouth daily.    Dispense:  30 tablet    Refill:  2      Lifestyle: Body mass index is 32.3 kg/m. Wt Readings from Last 3 Encounters:  09/09/19 188 lb 3.2 oz (85.4 kg)  06/08/19 190  lb (86.2 kg)  05/24/19 190 lb (86.2 kg)     Patient Active Problem List   Diagnosis Date Noted  . Tubular adenoma of colon 06/16/2019    Priority: High    Colonoscopy 05/2019, Dr. Tarri Glenn, repeat in 7 years   . COPD (chronic obstructive pulmonary disease) ([place]) 11/19/2010    Priority: High  . Obesity (BMI 35.0-39.9 without comorbidity) 03/12/2010    Priority: High    Obesity   . Tobacco use disorder 12/11/2009    Priority: High    Precontemplative; does not want to quit 05/2012/cla   . Major depression, recurrent ([place]) 03/03/2009    Priority: High  . Facet arthritis of cervical  region 09/09/2019    Priority: Medium  . 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    Priority: Medium  . Bilateral carpal tunnel syndrome 06/24/2017    Priority: Medium  . Chronic low back pain 06/23/2012    Priority: Medium  . Common migraine 03/23/2010    Priority: Medium  . GERD (gastroesophageal reflux disease) 03/12/2010    Priority: Medium  . Primary osteoarthritis of first carpometacarpal joint of right hand 06/24/2017    Priority: Low  . Drug addiction in remission [facility] 11/19/2010    Priority: Low    NA with sponsors; good support. Some relapses   . Urinary incontinence 04/09/2019   Health Maintenance  Topic Date Due  . Hepatitis C Screening  Never done  . COVID-19 Vaccine (1) Never done  . INFLUENZA VACCINE  08/15/2019  . MAMMOGRAM  03/14/2021  . COLONOSCOPY  06/08/2026  . TETANUS/TDAP  04/08/2029  . HIV Screening  Completed   Immunization History  Administered Date(s) Administered  . Hepatitis B, ped/adol 05/29/2007  . Influenza Inj Mdck Quad Pf 12/14/2018  . Influenza Split 11/30/2007, 11/16/2008, 10/28/2009, 01/27/2012  . Influenza, Seasonal, Injecte, Preservative Fre 10/01/2013  . Influenza,trivalent, recombinat, inj, PF 11/19/2010  . PPD Test 12/12/2008  . Pneumococcal Conjugate-13 11/09/2009  . Pneumococcal Polysaccharide-23 01/27/2012  . Td 05/29/2006  . Tdap 04/09/2019   We updated and reviewed the patient's past history in detail and it is documented below. Allergies: Patient is allergic to metronidazole and latex. 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 Past Surgical History Patient  has a past surgical history that includes Tonsillectomy and adenoidectomy; Tubal ligation; Cervical cone biopsy (1996); Total vaginal hysterectomy (07/2007); Laser ablation of the cervix; Myringotomy; Wisdom tooth extraction; and Vulvectomy (N/A, 09/25/2017). Family History: Patient family history includes Arthritis in her maternal grandmother; Breast cancer in her paternal grandmother; COPD in her maternal grandmother; Diabetes in her mother; Emphysema in her maternal grandmother; Heart failure in her mother and paternal grandfather; High blood pressure in her paternal grandfather; Lung cancer in her maternal grandfather; Stroke in her paternal grandmother; Tuberculosis in her paternal grandmother. Social History:  Patient  reports that she has been smoking cigarettes. She has a 20.00 pack-year smoking history. She has never used smokeless tobacco. She reports current alcohol use. She reports previous drug use. Drugs: Marijuana and "Crack" cocaine.  Review of Systems: Constitutional: negative for fever or malaise Ophthalmic: negative for photophobia,  double vision or loss of vision Cardiovascular: negative for chest pain, dyspnea on exertion, or new LE swelling Respiratory: negative for SOB or persistent cough Gastrointestinal: negative for abdominal pain, change in bowel habits or melena Genitourinary: negative for dysuria or gross hematuria, no abnormal uterine bleeding or disharge Musculoskeletal: negative for new gait disturbance or muscular weakness Integumentary: negative for new or persistent rashes, no breast lumps Neurological: negative for TIA or stroke symptoms Psychiatric: negative for SI or delusions Allergic/Immunologic: negative for hives  Patient Care Team    Relationship Specialty Notifications Start End  Leamon Arnt, MD PCP - General Family  Medicine  04/09/19   Isabel Caprice, MD Consulting Physician Gynecologic Oncology  04/09/19   Janyth Contes, MD Consulting Physician Obstetrics and Gynecology  04/09/19   [place] Park, MD Consulting Physician Gastroenterology  06/16/19     Objective  Vitals: BP 121/72   Pulse 86   Temp 98.6 F (37 C) (Temporal)   Ht 5\' 4"  (1.626 m)   Wt 188 lb 3.2 oz (85.4 kg)   SpO2 97%   BMI 32.30 kg/m  General:  Well developed, well nourished, no acute distress  Psych:  Alert and orientedx3,normal mood and affect HEENT:  Normocephalic, atraumatic, non-icteric sclera,  supple neck without adenopathy, mass or thyromegaly Cardiovascular:  Normal S1, S2, RRR without gallop, rub or murmur Respiratory:  Good breath sounds bilaterally, CTAB with normal respiratory effort Gastrointestinal: normal bowel sounds, soft, non-tender, no noted masses. No HSM MSK: no deformities, contusions. Joints are without erythema or swelling.  Skin:  Warm, no rashes or suspicious lesions noted Neurologic:    Mental status is normal. CN 2-11 are normal. Gross motor and sensory exams are normal. Normal gait. No tremor Breast Exam: No mass, skin retraction or nipple discharge is appreciated in either breast. No axillary adenopathy. Fibrocystic changes are not noted     Commons side effects, risks, benefits, and alternatives for medications and treatment plan prescribed today were discussed, and the patient expressed understanding of the given instructions. Patient is instructed to call or message via MyChart if he/she has any questions or concerns regarding our treatment plan. No barriers to understanding were identified. We discussed Red Flag symptoms and signs in detail. Patient expressed understanding regarding what to do in case of urgent or emergency type symptoms.   Medication list was reconciled, printed and provided to the patient in AVS. Patient instructions and summary information was reviewed with the  patient as documented in the AVS. This note was prepared with assistance of Dragon voice recognition software. Occasional wrong-word or sound-a-like substitutions may have occurred due to the inherent limitations of voice recognition software  This visit occurred during the SARS-CoV-2 public health emergency.  Safety protocols were in place, including screening questions prior to the visit, additional usage of staff PPE, and extensive cleaning of exam room while observing appropriate contact time as indicated for disinfecting solutions.

## 2019-09-09 NOTE — Therapy (Addendum)
[place] [address], [place], [address] Phone: [phone]   Fax:  [phone]  Physical Therapy Evaluation  Patient Details  Name: [name] MRN: [id number] Date of Birth: [birth year] Referring Provider (PT): Billey Chang, MD    Patient did not return to PT.  Please refer to initial evaluation for patient status. Thank you for the referral of this patient. Rudell Cobb, MPT    Encounter Date: 09/09/2019   PT End of Session - 09/09/19 1324    Visit Number 1    Number of Visits 6    Date for PT Re-Evaluation 10/21/19    PT Start Time 0848    PT Stop Time 0929    PT Time Calculation (min) 41 min    Activity Tolerance Patient tolerated treatment well    Behavior During Therapy [facility] for tasks assessed/performed           Past Medical History:  Diagnosis Date  . Anxiety   . Arthritis of carpometacarpal (CMC) joint of right thumb   . Bilateral carpal tunnel syndrome   . Breast cyst, right 12/18/2007   3 mm simple cyst at the 11 o'clock position  . Cervical dysplasia   . COPD (chronic obstructive pulmonary disease) ([place])    remote history of early symptoms  . Drug addiction ([place])   . 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 GERD (gastroesophageal reflux disease)    TUMS, OTC treatment as needed  . HSV infection   . Low back pain   . Major depression   . Migraines   . Tubular adenoma of colon 06/16/2019   Colonoscopy 05/2019, Dr. Tarri Glenn, repeat in 7 years  . VIN II (vulvar intraepithelial neoplasia II)     Past Surgical History:  Procedure Laterality Date  . CERVICAL CONE BIOPSY  1996   Laser  . LASER ABLATION OF THE CERVIX    . MYRINGOTOMY     Bilateral, age 81 and 52  . TONSILLECTOMY AND ADENOIDECTOMY    . TOTAL VAGINAL HYSTERECTOMY  07/2007   TVH-menorrhagia, dysmenorrhea, still have fallopians tubes and ovaries  . TUBAL LIGATION     Tubal Ligation/Steriization  .  VULVECTOMY N/A 09/25/2017   Procedure: PARTIAL VULVECTOMY;  Surgeon: Isabel Caprice, MD;  [facility];  Service: Gynecology;  Laterality: N/A;  . WISDOM TOOTH EXTRACTION      There were no vitals filed for this visit.    Subjective Assessment - 09/09/19 0849    Subjective The patient has had dizzy spells since teenager years.  She would notice it when flipping her hair over and returning to upright.  She has used dramamine in the past to manage nausea and dizziness.  She had a spell on the morning 07/23/19 before a dentist appointment.  She reports vision change, double vision accompanying vertigo symptoms.  She was okay in dentist chair with leaning back, but severe symptoms hit when she sat up.  She reports the room went black, she had nausea, sweating and room spinning.  These symptoms lasted into the next day (was taking dramamine).  She is continuing to have room spinning with bed mobility that settles within a minute.  With getting up in the morning, she has to take her time to let everything settle.    Pertinent History h/o migraines (with light and sound sensitivity--has not had one in 6 months), daily headaches, h/o vertiginous episodes    Patient  Stated Goals get rid of vertigo    Currently in Pain? No/denies              [facility] PT Assessment - 09/09/19 1601      Assessment   Medical Diagnosis vertigo    Referring Provider (PT) Billey Chang, MD    Onset Date/Surgical Date 07/23/19    Hand Dominance Right    Prior Therapy none      Precautions   Precautions None      Restrictions   Weight Bearing Restrictions No      Balance Screen   Has the patient fallen in the past 6 months No    Has the patient had a decrease in activity level because of a fear of falling?  No    Is the patient reluctant to leave their home because of a fear of falling?  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  She describes a sensation of water in her ears at times.  She notes muffled hearing at times and intermittent tinnitus in the L ear.       Symptom Behavior   Subjective history of current problem Sudden onset of acute vertigo 07/23/19     Type of Dizziness  Spinning    Frequency of Dizziness daily    Duration of Dizziness 60 seconds    Symptom Nature Positional;Motion provoked    Aggravating Factors Looking up to the ceiling;Mornings;Turning head quickly;Rolling to left;Forward bending    Relieving Factors Head stationary    Progression of Symptoms Better    History of similar episodes h/o episodic vertigo since teenage years      Oculomotor Exam   Oculomotor Alignment Normal    Ocular ROM WFLs    Spontaneous Absent    Gaze-induced  Absent    Smooth Pursuits Intact    Saccades Intact      Vestibulo-Ocular Reflex   VOR 1 Head Only (x 1 viewing) x 10 reps at self regulated pace:  "my R eye feels weird", but no dizziness    Comment head impulse test=+ for refixation saccade (low amplitude) to target rated 1/10 symptoms      Positional Testing   Dix-Hallpike Dix-Hallpike Right;Dix-Hallpike Left    Horizontal Canal Testing Horizontal Canal Right;Horizontal Canal Left      Dix-Hallpike Right   Dix-Hallpike Right Duration none    Dix-Hallpike Right Symptoms No nystagmus      Dix-Hallpike Left   Dix-Hallpike Left Duration 30 seconds    Dix-Hallpike Left Symptoms Upbeat, left rotatory nystagmus      Horizontal Canal Right   Horizontal Canal Right Duration none    Horizontal Canal Right Symptoms Normal      Horizontal Canal Left   Horizontal Canal Left Duration none    Horizontal  Canal Left Symptoms Normal              Objective measurements completed on examination: See above findings.        Vestibular Treatment/Exercise - 09/09/19 0932  Vestibular Treatment/Exercise   Vestibular Treatment Provided Canalith Repositioning;Gaze    Canalith Repositioning Epley Manuever Left    Gaze Exercises X1 Viewing Horizontal       EPLEY MANUEVER LEFT   Number of Reps  2    Overall Response  Improved Symptoms     RESPONSE DETAILS LEFT trace nystagmus viewed on 2nd rep      X1 Viewing Horizontal   Foot Position seated    Comments 30 seconds with cues for target fixation                 PT Education - 09/09/19 0923    Education Details HEP initiated    Person(s) Educated Patient    Methods Explanation;Demonstration;Handout    Comprehension Verbalized understanding;Returned demonstration               PT Long Term Goals - 09/09/19 0926      PT LONG TERM GOAL #1   Title The patient will be indep with self mgmt of BPPV    Time 6    Period Weeks    Target Date 10/21/19      PT LONG TERM GOAL #2   Title The patient will have negative L positional testing to demo resolution of BPPV.    Time 6    Period Weeks    Target Date 10/21/19      PT LONG TERM GOAL #3   Title The patient will tolerate gaze x 1 x 1 minutes without dizziness.    Time 6    Period Weeks    Target Date 10/21/19                  Plan - 09/09/19 0929    Clinical Impression Statement The patient is a 54 yo female presenting to OP PT with acute on chronic vertigo.  She has h/o intermittent episodes and experienced acute onset of room spinning vertigo 07/23/19.  She presents today with + head impulse test indicating dec'd VOR, positive L dix hallpike responding well to Epley's maneuver.  PT to address deficits to return to prior functional status.    Personal Factors and Comorbidities Comorbidity 1    Comorbidities h/o migraines    Examination-Activity  Limitations Bend;Bed Mobility    Examination-Participation Restrictions Cleaning;Community Activity    Stability/Clinical Decision Making Stable/Uncomplicated    Clinical Decision Making Low    Rehab Potential Good    PT Frequency 1x / week    PT Duration 6 weeks    PT Treatment/Interventions Canalith Repostioning;ADLs/Self Care Home Management;Vestibular;Patient/family education;Neuromuscular re-education;Balance training;Manual techniques    PT Next Visit Plan check vertigo, progress gaze adaptation    PT Home Exercise Plan Access Code [facility]    Consulted and Agree with Plan of Care Patient           Patient will benefit from skilled therapeutic intervention in order to improve the following deficits and impairments:  Dizziness, Decreased balance  Visit Diagnosis: BPPV (benign paroxysmal positional vertigo), left  Dizziness and giddiness     Problem List Patient Active Problem List   Diagnosis Date Noted  . Tubular adenoma of colon 06/16/2019  . Urinary incontinence 04/09/2019  . 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 Bilateral carpal tunnel syndrome 06/24/2017  . Primary osteoarthritis of first carpometacarpal joint of right hand 06/24/2017  . Chronic low back pain 06/23/2012  . COPD (chronic obstructive pulmonary disease) ([place]) 11/19/2010  . Drug addiction in remission ([place]) 11/19/2010  .  Common migraine 03/23/2010  . GERD (gastroesophageal reflux disease) 03/12/2010  . Obesity (BMI 35.0-39.9 without comorbidity) 03/12/2010  . Tobacco use disorder 12/11/2009  . Major depression, recurrent ([place]) 03/03/2009    [place], [place] 09/09/2019, 9:31 AM  [facility] [place], [place], [address] Phone: [phone]   Fax:  [phone]  Name: [name] MRN: [id number] Date of Birth: Jan 22, 1965

## 2019-09-10 LAB — CBC WITH DIFFERENTIAL/PLATELET
Absolute Monocytes: 570 cells/uL (ref 200–950)
Basophils Absolute: 46 cells/uL (ref 0–200)
Basophils Relative: 0.5 %
Eosinophils Absolute: 396 cells/uL (ref 15–500)
Eosinophils Relative: 4.3 %
HCT: 43.8 % (ref 35.0–45.0)
Hemoglobin: 14.8 g/dL (ref 11.7–15.5)
Lymphs Abs: 2824 cells/uL (ref 850–3900)
MCH: 31.2 pg (ref 27.0–33.0)
MCHC: 33.8 g/dL (ref 32.0–36.0)
MCV: 92.2 fL (ref 80.0–100.0)
MPV: 10.8 fL (ref 7.5–12.5)
Monocytes Relative: 6.2 %
Neutro Abs: 5364 cells/uL (ref 1500–7800)
Neutrophils Relative %: 58.3 %
Platelets: 292 10*3/uL (ref 140–400)
RBC: 4.75 10*6/uL (ref 3.80–5.10)
RDW: 12.3 % (ref 11.0–15.0)
Total Lymphocyte: 30.7 %
WBC: 9.2 10*3/uL (ref 3.8–10.8)

## 2019-09-10 LAB — COMPREHENSIVE METABOLIC PANEL
AG Ratio: 2 (calc) (ref 1.0–2.5)
ALT: 23 U/L (ref 6–29)
AST: 21 U/L (ref 10–35)
Albumin: 4.4 g/dL (ref 3.6–5.1)
Alkaline phosphatase (APISO): 55 U/L (ref 37–153)
BUN: 10 mg/dL (ref 7–25)
CO2: 30 mmol/L (ref 20–32)
Calcium: 9.2 mg/dL (ref 8.6–10.4)
Chloride: 103 mmol/L (ref 98–110)
Creat: 0.92 mg/dL (ref 0.50–1.05)
Globulin: 2.2 g/dL (calc) (ref 1.9–3.7)
Glucose, Bld: 104 mg/dL — ABNORMAL HIGH (ref 65–99)
Potassium: 3.8 mmol/L (ref 3.5–5.3)
Sodium: 139 mmol/L (ref 135–146)
Total Bilirubin: 0.4 mg/dL (ref 0.2–1.2)
Total Protein: 6.6 g/dL (ref 6.1–8.1)

## 2019-09-10 LAB — LIPID PANEL
Cholesterol: 184 mg/dL (ref <200)
HDL: 49 mg/dL — ABNORMAL LOW (ref >=50)
LDL Cholesterol (Calc): 102 mg/dL (calc) — ABNORMAL HIGH
Non-HDL Cholesterol (Calc): 135 mg/dL (calc) — ABNORMAL HIGH (ref <130)
Total CHOL/HDL Ratio: 3.8 (calc) (ref <5.0)
Triglycerides: 212 mg/dL — ABNORMAL HIGH (ref <150)

## 2019-09-10 LAB — HEPATITIS C ANTIBODY
Hepatitis C Ab: NONREACTIVE
SIGNAL TO CUT-OFF: 0.01 (ref <1.00)

## 2019-09-10 LAB — HIV ANTIBODY (ROUTINE TESTING W REFLEX): HIV 1&2 Ab, 4th Generation: NONREACTIVE

## 2019-09-16 ENCOUNTER — Encounter: Payer: 59 | Admitting: Rehabilitative and Restorative Service Providers"

## 2019-09-17 ENCOUNTER — Encounter: Payer: 59 | Admitting: Rehabilitative and Restorative Service Providers"

## 2019-09-29 ENCOUNTER — Other Ambulatory Visit: Payer: Self-pay | Admitting: Family Medicine

## 2019-09-29 MED ORDER — DICLOFENAC SODIUM 75 MG PO TBEC
75.0000 mg | DELAYED_RELEASE_TABLET | Freq: Two times a day (BID) | ORAL | 0 refills | Status: DC
Start: 2019-09-29 — End: 2019-11-15

## 2019-09-29 NOTE — Telephone Encounter (Signed)
Okay to refill? 

## 2019-10-19 ENCOUNTER — Encounter: Payer: Self-pay | Admitting: Family Medicine

## 2019-11-15 ENCOUNTER — Other Ambulatory Visit: Payer: Self-pay | Admitting: Family Medicine

## 2019-12-05 ENCOUNTER — Encounter: Payer: Self-pay | Admitting: Family Medicine

## 2019-12-06 MED ORDER — FLUCONAZOLE 150 MG PO TABS: ORAL_TABLET | ORAL | 0 refills | Status: DC

## 2019-12-16 ENCOUNTER — Other Ambulatory Visit: Payer: Self-pay | Admitting: Family Medicine

## 2019-12-17 MED ORDER — DICLOFENAC SODIUM 75 MG PO TBEC
75.0000 mg | DELAYED_RELEASE_TABLET | Freq: Two times a day (BID) | ORAL | 0 refills | Status: DC
Start: 2019-12-17 — End: 2020-02-10

## 2020-01-05 ENCOUNTER — Telehealth: Payer: Self-pay

## 2020-01-05 NOTE — Telephone Encounter (Signed)
Pt states she is experiencing severe abdominal issues. Pt is asking to be seen. Dr. Tamela Oddi first available appt is not until Jan 14th - can I schedule in a same day slot? Please advise.

## 2020-01-05 NOTE — Telephone Encounter (Signed)
Called pt and LVM to schedule appt

## 2020-01-05 NOTE — Telephone Encounter (Signed)
Patient can either see another provider or be scheduled the week of Jan 3rd.

## 2020-01-10 ENCOUNTER — Ambulatory Visit: Payer: 59 | Admitting: Family Medicine

## 2020-02-09 ENCOUNTER — Other Ambulatory Visit: Payer: Self-pay | Admitting: Family Medicine

## 2020-02-16 ENCOUNTER — Other Ambulatory Visit: Payer: Self-pay

## 2020-02-16 ENCOUNTER — Encounter: Payer: Self-pay | Admitting: Family Medicine

## 2020-02-16 MED ORDER — ESCITALOPRAM OXALATE 10 MG PO TABS
10.0000 mg | ORAL_TABLET | Freq: Every day | ORAL | 3 refills | Status: DC
Start: 2020-02-16 — End: 2020-07-05

## 2020-02-16 MED ORDER — GABAPENTIN 100 MG PO CAPS
ORAL_CAPSULE | ORAL | 3 refills | Status: DC
Start: 2020-02-16 — End: 2021-01-20

## 2020-03-01 ENCOUNTER — Other Ambulatory Visit: Payer: Self-pay | Admitting: Family Medicine

## 2020-03-20 ENCOUNTER — Ambulatory Visit: Payer: 59 | Admitting: Family Medicine

## 2020-03-20 ENCOUNTER — Encounter: Payer: Self-pay | Admitting: Family Medicine

## 2020-03-20 NOTE — Telephone Encounter (Signed)
Please cancel patients appointment.

## 2020-05-03 ENCOUNTER — Other Ambulatory Visit: Payer: Self-pay | Admitting: Family Medicine

## 2020-05-08 ENCOUNTER — Other Ambulatory Visit: Payer: Self-pay | Admitting: Family Medicine

## 2020-05-29 ENCOUNTER — Other Ambulatory Visit: Payer: Self-pay | Admitting: Family Medicine

## 2020-05-30 ENCOUNTER — Telehealth (INDEPENDENT_AMBULATORY_CARE_PROVIDER_SITE_OTHER): Payer: 59 | Admitting: Family Medicine

## 2020-05-30 ENCOUNTER — Other Ambulatory Visit: Payer: Self-pay | Admitting: Family Medicine

## 2020-05-30 ENCOUNTER — Encounter: Payer: Self-pay | Admitting: Family Medicine

## 2020-05-30 VITALS — Ht 64.0 in | Wt 209.0 lb

## 2020-05-30 DIAGNOSIS — K529 Noninfective gastroenteritis and colitis, unspecified: Secondary | ICD-10-CM | POA: Diagnosis not present

## 2020-05-30 MED ORDER — DICLOFENAC SODIUM 75 MG PO TBEC: 75.0000 mg | DELAYED_RELEASE_TABLET | Freq: Two times a day (BID) | ORAL | 0 refills | Status: DC

## 2020-05-30 MED ORDER — ONDANSETRON 4 MG PO TBDP: 4.0000 mg | ORAL_TABLET | Freq: Three times a day (TID) | ORAL | 0 refills | Status: DC | PRN

## 2020-05-30 NOTE — Progress Notes (Signed)
[facility] [address] [place] [address] Dept: [phone] Dept Fax: [phone]  Virtual Video Visit  I connected with [name] on 05/30/20 at  2:00 PM EDT by a video enabled telemedicine application and verified that I am speaking with the correct person using two identifiers.  Location patient: Home Location provider: Clinic Persons participating in the virtual visit: Patient, Provider  I discussed the limitations of evaluation and management by telemedicine and the availability of in person appointments. The patient expressed understanding and agreed to proceed.  Chief Complaint  Patient presents with  . Acute Visit    C/o having vomiting/diarrhea x 3 days.   She has taken kaopectate, water, Gatorade.     SUBJECTIVE:  HPI: [name] is a 55 y.o. female who presents with a 3-day history of nausea, vomiting, and diarrhea. [name] notes that this started Sunday evening after supper. Since then, she has had repeated episodes of watery diarrhea with some occasional vomiting. She has been trying to push fluids (water, Gatorade) and she has been using Kaopectate without much improvement. She denies any fever or orthostasis. She has felt generally weak. She has missed work the last 2 days.   [name] notes her father was recently diagnosed with cancer and is not doing well. He is currently int he hospital with plans ot do urgent surgery and the family has counseled that he may not survive this. She admits that this has been stressful and feels this may have exacerbated her symptoms.   Patient Active Problem List   Diagnosis Date Noted  . Facet arthritis of cervical region 09/09/2019  . Tubular adenoma of colon 06/16/2019  . Urinary incontinence 04/09/2019  . 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 Bilateral carpal tunnel syndrome 06/24/2017  . Primary osteoarthritis of first  carpometacarpal joint of right hand 06/24/2017  . Chronic low back pain 06/23/2012  . COPD (chronic obstructive pulmonary disease) ([place]) 11/19/2010  . Drug addiction in remission ([place]) 11/19/2010  . Common migraine 03/23/2010  . GERD (gastroesophageal reflux disease) 03/12/2010  . Obesity (BMI 35.0-39.9 without comorbidity) 03/12/2010  . Tobacco use disorder 12/11/2009  . Major depression, recurrent ([place]) 03/03/2009   Past Surgical History:  Procedure Laterality Date  . CERVICAL CONE BIOPSY  1996   Laser  . LASER ABLATION OF THE CERVIX    . MYRINGOTOMY     Bilateral, age 86 and 52  . TONSILLECTOMY AND ADENOIDECTOMY    . TOTAL VAGINAL HYSTERECTOMY  07/2007   TVH-menorrhagia, dysmenorrhea, still have fallopians tubes and ovaries  . TUBAL LIGATION     Tubal Ligation/Steriization  . VULVECTOMY N/A 09/25/2017   Procedure: PARTIAL VULVECTOMY;  Surgeon: Angela Caprice, MD;  Location: [facility];  Service: Gynecology;  Laterality: N/A;  . WISDOM TOOTH EXTRACTION     Family History  Problem Relation Age of Onset  . Heart failure Mother   . Diabetes Mother   . Breast cancer Paternal Grandmother   . Tuberculosis Paternal Grandmother   . Stroke Paternal Grandmother   . COPD Maternal Grandmother   . Emphysema Maternal Grandmother   . Arthritis Maternal Grandmother   . Lung cancer Maternal Grandfather   . Heart failure Paternal Grandfather   . High blood pressure Paternal Grandfather   . Colon cancer Neg Hx   . Colon polyps Neg Hx   . Esophageal cancer Neg Hx   . Rectal cancer Neg Hx   .  Stomach cancer Neg Hx    Social History   Tobacco Use  . Smoking status: Current Every Day Smoker    Packs/day: 0.50    Years: 40.00    Pack years: 20.00    Types: Cigarettes  . Smokeless tobacco: Never Used  . Tobacco comment: Patient smokes less than 1/2 pack of cigarettes a day  Vaping Use  . Vaping Use: Never used  Substance Use Topics  . Alcohol use: Yes    Comment:  occ wine  . Drug use: Not Currently    Types: Marijuana, "Crack" cocaine    Comment: 5 years clean    Current Outpatient Medications:  .  acetaminophen (TYLENOL) 325 MG tablet, Take 650 mg by mouth every 6 (six) hours as needed., Disp: , Rfl:  .  ASPIRIN 81 PO, Take by mouth., Disp: , Rfl:  .  BREZTRI AEROSPHERE 160-9-4.8 MCG/ACT AERO, INHALE 2 PUFFS BY MOUTH IN THE MORNING AND AT BEDTIME, Disp: 10.7 g, Rfl: 5 .  cyclobenzaprine (FLEXERIL) 10 MG tablet, Take 1 tablet (10 mg total) by mouth at bedtime as needed for muscle spasms., Disp: 30 tablet, Rfl: 0 .  diclofenac (VOLTAREN) 75 MG EC tablet, Take 1 tablet (75 mg total) by mouth 2 (two) times daily., Disp: 60 tablet, Rfl: 0 .  escitalopram (LEXAPRO) 10 MG tablet, Take 1 tablet (10 mg total) by mouth daily., Disp: 90 tablet, Rfl: 3 .  gabapentin (NEURONTIN) 100 MG capsule, TAKE 1 -3 CAPSULES BY MOUTH AT BEDTIME, Disp: 270 capsule, Rfl: 3 .  meclizine (ANTIVERT) 12.5 MG tablet, Take 1 tablet (12.5 mg total) by mouth 3 (three) times daily as needed for dizziness., Disp: 30 tablet, Rfl: 0 .  ondansetron (ZOFRAN ODT) 4 MG disintegrating tablet, Take 1 tablet (4 mg total) by mouth every 8 (eight) hours as needed for nausea or vomiting., Disp: 20 tablet, Rfl: 0  Allergies  Allergen Reactions  . Metronidazole Itching  . Latex Rash   ROS: See pertinent positives and negatives per HPI.  OBSERVATIONS/OBJECTIVE:  VITALS per patient if applicable: Today's Vitals   05/30/20 1358  Weight: 209 lb (94.8 kg)  Height: 5\' 4"  (1.626 m)   Body mass index is 35.87 kg/m.   GENERAL: Alert and oriented. Appears well and in no acute distress.  HEENT: Atraumatic. Conjunctiva clear. No obvious abnormalities on inspection of external nose and ears.  NECK: Normal movements of the head and neck.  LUNGS: On inspection, no signs of respiratory distress. Breathing rate appears normal. No obvious gross SOB, gasping or wheezing, and no conversational  dyspnea.  CV: No obvious cyanosis.  MS: Moves all visible extremities without noticeable abnormality.  PSYCH/NEURO: Pleasant and cooperative. No obvious depression or anxiety. Speech and thought processing grossly intact.  ASSESSMENT AND PLAN:  1. Gastroenteritis I suspect she has a viral gastroenteritis. I recommended she use OTC Imodium 2 tabs initially, then 1 tab with each loose stool, up to 6 tabs a day. I will also prescribe some Zofran for nausea. She should continue to try to push fluids and advance her diet as tolerated. I will provide her with a work excuse.  - ondansetron (ZOFRAN ODT) 4 MG disintegrating tablet; Take 1 tablet (4 mg total) by mouth every 8 (eight) hours as needed for nausea or vomiting.  Dispense: 20 tablet; Refill: 0  I discussed the assessment and treatment plan with the patient. The patient was provided an opportunity to ask questions and all were answered. The patient agreed with  the plan and demonstrated an understanding of the instructions.   The patient was advised to call back or seek an in-person evaluation if the symptoms worsen or if the condition fails to improve as anticipated.   Haydee Salter, MD

## 2020-06-27 ENCOUNTER — Other Ambulatory Visit: Payer: Self-pay | Admitting: Family Medicine

## 2020-07-05 ENCOUNTER — Encounter: Payer: Self-pay | Admitting: Family Medicine

## 2020-07-05 ENCOUNTER — Ambulatory Visit (INDEPENDENT_AMBULATORY_CARE_PROVIDER_SITE_OTHER): Payer: 59

## 2020-07-05 ENCOUNTER — Other Ambulatory Visit: Payer: Self-pay

## 2020-07-05 ENCOUNTER — Ambulatory Visit (INDEPENDENT_AMBULATORY_CARE_PROVIDER_SITE_OTHER): Payer: 59 | Admitting: Family Medicine

## 2020-07-05 VITALS — BP 145/82 | HR 64 | Temp 98.6°F | Ht 64.0 in | Wt 202.2 lb

## 2020-07-05 DIAGNOSIS — R5383 Other fatigue: Secondary | ICD-10-CM

## 2020-07-05 DIAGNOSIS — K529 Noninfective gastroenteritis and colitis, unspecified: Secondary | ICD-10-CM | POA: Diagnosis not present

## 2020-07-05 DIAGNOSIS — F331 Major depressive disorder, recurrent, moderate: Secondary | ICD-10-CM

## 2020-07-05 DIAGNOSIS — M25461 Effusion, right knee: Secondary | ICD-10-CM

## 2020-07-05 DIAGNOSIS — N951 Menopausal and female climacteric states: Secondary | ICD-10-CM

## 2020-07-05 DIAGNOSIS — R1010 Upper abdominal pain, unspecified: Secondary | ICD-10-CM | POA: Diagnosis not present

## 2020-07-05 DIAGNOSIS — R1310 Dysphagia, unspecified: Secondary | ICD-10-CM

## 2020-07-05 DIAGNOSIS — Z1231 Encounter for screening mammogram for malignant neoplasm of breast: Secondary | ICD-10-CM

## 2020-07-05 DIAGNOSIS — K219 Gastro-esophageal reflux disease without esophagitis: Secondary | ICD-10-CM

## 2020-07-05 LAB — CBC WITH DIFFERENTIAL/PLATELET
Basophils Absolute: 0.1 10*3/uL (ref 0.0–0.1)
Basophils Relative: 0.7 % (ref 0.0–3.0)
Eosinophils Absolute: 0.5 10*3/uL (ref 0.0–0.7)
Eosinophils Relative: 5.7 % — ABNORMAL HIGH (ref 0.0–5.0)
HCT: 43.9 % (ref 36.0–46.0)
Hemoglobin: 14.7 g/dL (ref 12.0–15.0)
Lymphocytes Relative: 32.5 % (ref 12.0–46.0)
Lymphs Abs: 3 10*3/uL (ref 0.7–4.0)
MCHC: 33.6 g/dL (ref 30.0–36.0)
MCV: 95 fl (ref 78.0–100.0)
Monocytes Absolute: 0.6 10*3/uL (ref 0.1–1.0)
Monocytes Relative: 6.6 % (ref 3.0–12.0)
Neutro Abs: 5 10*3/uL (ref 1.4–7.7)
Neutrophils Relative %: 54.5 % (ref 43.0–77.0)
Platelets: 229 10*3/uL (ref 150.0–400.0)
RBC: 4.62 Mil/uL (ref 3.87–5.11)
RDW: 13.4 % (ref 11.5–15.5)
WBC: 9.3 10*3/uL (ref 4.0–10.5)

## 2020-07-05 LAB — COMPREHENSIVE METABOLIC PANEL
ALT: 34 U/L (ref 0–35)
AST: 24 U/L (ref 0–37)
Albumin: 4.3 g/dL (ref 3.5–5.2)
Alkaline Phosphatase: 66 U/L (ref 39–117)
BUN: 13 mg/dL (ref 6–23)
CO2: 28 mEq/L (ref 19–32)
Calcium: 9.1 mg/dL (ref 8.4–10.5)
Chloride: 103 mEq/L (ref 96–112)
Creatinine, Ser: 0.84 mg/dL (ref 0.40–1.20)
GFR: 79.44 mL/min (ref >60.00)
Glucose, Bld: 94 mg/dL (ref 70–99)
Potassium: 4.5 mEq/L (ref 3.5–5.1)
Sodium: 138 mEq/L (ref 135–145)
Total Bilirubin: 0.4 mg/dL (ref 0.2–1.2)
Total Protein: 6.6 g/dL (ref 6.0–8.3)

## 2020-07-05 LAB — SEDIMENTATION RATE: Sed Rate: 4 mm/hr (ref 0–30)

## 2020-07-05 LAB — LIPASE: Lipase: 18 U/L (ref 11.0–59.0)

## 2020-07-05 LAB — TSH: TSH: 2.71 u[IU]/mL (ref 0.35–4.50)

## 2020-07-05 MED ORDER — ESCITALOPRAM OXALATE 10 MG PO TABS: 20.0000 mg | ORAL_TABLET | Freq: Every day | ORAL | 3 refills | Status: DC

## 2020-07-05 MED ORDER — OMEPRAZOLE 20 MG PO CPDR: 20.0000 mg | DELAYED_RELEASE_CAPSULE | Freq: Every day | ORAL | 3 refills | Status: DC

## 2020-07-05 NOTE — Progress Notes (Signed)
Subjective  CC:  Chief Complaint  Patient presents with   GI Problem    Loose stool constantly, nausea, abdominal pain   Depression    HPI: [name] is a 55 y.o. female who presents to the office today to address the problems listed above in the chief complaint. Patient complains of multiple symptoms including upper abdominal pain but different from her typical GERD no longer on PPIs.  Complains of her food catching when she is swallowing in the chest.  More with solids and liquids.  Some nausea but no vomiting or regurgitation.  Pain comes and goes.  Denies left upper quadrant or right upper quadrant pain.  She wants to know if she needs an EGD.  She also complains of chronic, several years, of loose stools.  She denies formed bowel movements ever.  Loose watery stools without mucus or blood.  She had a colonoscopy in May 2021 which revealed a tubular adenoma.  She had this with Dr. Janace Hoard.  She has never been evaluated for chronic diarrhea.  She denies lower abdominal pain.  No weight changes.  She does not believe it is food triggered.  She does describe fecal incontinence. Major depression: Active again.  Unfortunately her father was in the hospital since April.  Diagnosed with metastatic cancer.  He is unlikely to survive.  She has multiple stressors.  She feels more irritable and down.  She has been on Lexapro for the last year or so and feels it has been helpful.  However is no longer fully controlling her symptoms. Menopausal hot flashes improved on gabapentin.  Sleeping well.  Sleeps about 6 to 7 hours per night. Complains of fatigue, chronic but worse over the last several months.  She is very concerned that she has cancer.  She would like "a cancer test ".  Routine cancer screenings are up-to-date except for mammogram for which she is due.  She denies fevers, chills, weight changes, masses.  She reports she is tired all the time.  She feels she sleeps well.  Denies snoring or apnea  symptoms. Complaining of right knee pain for the last 2 days.  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  Moving large objects.  She denies an identifiable injury.  She reports pain in the front and back of the knee.  Feels unstable.  No locking but feels stiff.  Limping due to pain.  She has osteoarthritis in that knee.  No other injuries.  Swelling has gone down a bit in the last 24 hours.  Assessment  1. Upper abdominal pain   2. Dysphagia, unspecified type   3. Chronic diarrhea   4. Moderate episode of recurrent major depressive disorder ([place])   5. Gastroesophageal reflux disease without esophagitis   6. Effusion of right knee   7. Menopausal symptoms   8. Fatigue, unspecified type   9. Encounter for screening mammogram for breast cancer      Plan  GI symptoms: She has a constellation of symptoms including upper and lower tract symptoms.  Restart PPI for possible GERD related symptoms.  Check basic lab work.  Refer back to GI for further evaluation due to chronic diarrhea and fecal incontinence.  Also will need evaluation for dysphagia. Fatigue: Likely multifactorial including active depression symptoms, menopause and increase in life stressors.  Reassured.  Check basic lab work.  Encourage good sleep hygiene.  Counseling done Right knee pain: Concerning for osteoarthritic flare or  meniscal injury.  Knee sleeve recommended, icing and return in 1 week for further evaluation if needed.  Check x-rays. Menopausal symptoms: Continue gabapentin nightly. Fear of cancer: Counseling done.  Discussed routine cancer screenings but educated that there is no one test that could identify all concerns. Depression: Active: Increase Lexapro to 20 mg daily.  Recheck 6 weeks Health maintenance: Patient to schedule her mammogram which is due.   Today's visit was 43 minutes long. Greater than 50% of this time was devoted to face to face counseling with the patient and coordination  of care. We discussed her diagnosis, prognosis, treatment options and treatment plan is documented above.   Follow up: 1 week to recheck the knee Visit date not found  Orders Placed This Encounter  Procedures   DG Knee Complete 4 Views Right   MM DIGITAL SCREENING BILATERAL   CBC with Differential/Platelet   TSH   Sedimentation rate   Comprehensive metabolic panel   Lipase   Ambulatory referral to Gastroenterology   Meds ordered this encounter  Medications   escitalopram (LEXAPRO) 10 MG tablet    Sig: Take 2 tablets (20 mg total) by mouth daily.    Dispense:  90 tablet    Refill:  3   omeprazole (PRILOSEC) 20 MG capsule    Sig: Take 1 capsule (20 mg total) by mouth daily.    Dispense:  30 capsule    Refill:  3      I reviewed the patients updated PMH, FH, and SocHx.    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    Priority: Medium   Bilateral carpal tunnel syndrome 06/24/2017    Priority: Medium   Chronic low back pain 06/23/2012    Priority: Medium   Common migraine 03/23/2010    Priority: Medium   GERD (gastroesophageal reflux disease) 03/12/2010    Priority: Medium   Primary osteoarthritis of first carpometacarpal joint of right hand 06/24/2017    Priority: Low   Drug addiction in remission ([place]) 11/19/2010    Priority: Low   Menopausal symptoms 07/05/2020   Urinary incontinence 04/09/2019   Current Meds  Medication Sig   acetaminophen (TYLENOL) 325 MG tablet Take 650 mg by mouth every 6 (six)  hours as needed.   ASPIRIN 81 PO Take by mouth.   BREZTRI AEROSPHERE 160-9-4.8 MCG/ACT AERO INHALE 2 PUFFS BY MOUTH IN THE MORNING AND AT BEDTIME   cyclobenzaprine (FLEXERIL) 10 MG tablet Take 1 tablet (10 mg total) by mouth at bedtime as needed for muscle spasms.   diclofenac (VOLTAREN) 75 MG EC tablet TAKE 1 TABLET(75 MG) BY MOUTH TWICE DAILY   gabapentin (NEURONTIN) 100 MG capsule TAKE 1 -3 CAPSULES BY MOUTH AT BEDTIME   meclizine (ANTIVERT) 12.5 MG tablet Take 1 tablet (12.5 mg total) by mouth 3 (three) times daily as needed for dizziness.   omeprazole (PRILOSEC) 20 MG capsule Take 1 capsule (20 mg total) by mouth daily.   ondansetron (ZOFRAN ODT) 4 MG disintegrating  tablet Take 1 tablet (4 mg total) by mouth every 8 (eight) hours as needed for nausea or vomiting.   [DISCONTINUED] escitalopram (LEXAPRO) 10 MG tablet Take 1 tablet (10 mg total) by mouth daily.    Allergies: Patient is allergic to metronidazole and latex. Family History: Patient family history includes Arthritis in her maternal grandmother; Breast cancer in her paternal grandmother; COPD in her maternal grandmother; Diabetes in her mother; Emphysema in her maternal grandmother; Heart failure in her mother and paternal grandfather; High blood pressure in her paternal grandfather; Lung cancer in her maternal grandfather; Stroke in her paternal grandmother; Tuberculosis in her paternal grandmother. Social History:  Patient  reports that she has been smoking cigarettes. She has a 20.00 pack-year smoking history. She has never used smokeless tobacco. She reports current alcohol use. She reports previous drug use. Drugs: Marijuana and "Crack" cocaine.  Review of Systems: Constitutional: Negative for fever malaise or anorexia Cardiovascular: negative for chest pain Respiratory: negative for SOB or persistent cough Gastrointestinal: negative for abdominal pain  Objective  Vitals: BP (!) 145/82   Pulse 64   Temp 98.6 F (37 C)  (Temporal)   Ht 5\' 4"  (1.626 m)   Wt 202 lb 3.2 oz (91.7 kg)   SpO2 97%   BMI 34.71 kg/m  General: no acute distress , A&Ox3 Psych: Anxious HEENT: PEERL, conjunctiva normal, neck is supple Cardiovascular:  RRR without murmur or gallop.  Respiratory:  Good breath sounds bilaterally, CTAB with normal respiratory effort Abdomen: Benign Right knee: Effusion present without warmth or erythema.  Full range of motion.  Pain with extreme flexion.  Unable to do a full squat.  McMurray's is positive for tenderness but no click.  Negative Lachman's.  Antalgic gait.    Commons side effects, risks, benefits, and alternatives for medications and treatment plan prescribed today were discussed, and the patient expressed understanding of the given instructions. Patient is instructed to call or message via MyChart if he/she has any questions or concerns regarding our treatment plan. No barriers to understanding were identified. We discussed Red Flag symptoms and signs in detail. Patient expressed understanding regarding what to do in case of urgent or emergency type symptoms.  Medication list was reconciled, printed and provided to the patient in AVS. Patient instructions and summary information was reviewed with the patient as documented in the AVS. This note was prepared with assistance of Dragon voice recognition software. Occasional wrong-word or sound-a-like substitutions may have occurred due to the inherent limitations of voice recognition software  This visit occurred during the SARS-CoV-2 public health emergency.  Safety protocols were in place, including screening questions prior to the visit, additional usage of staff PPE, and extensive cleaning of exam room while observing appropriate contact time as indicated for disinfecting solutions.

## 2020-07-05 NOTE — Patient Instructions (Addendum)
Please return in 1 week for knee pain; Please schedule CPE in September  We will call you with information regarding your referral appointment. GI for your diarrhea and abdominal pain.  If you do not hear from [place] within the next 2 weeks, please let me know. It can take 1-2 weeks to get appointments set up with the specialists.    Ice your knee and wear the support brace.  Please stop taknig the aspirin daily. This can be hard on your stomach.  Restart omeprazole daily to see if this improved your upper abdominal pain.   I will release your lab results to you on your MyChart account with further instructions. Please reply with any questions.    If you have any questions or concerns, please don't hesitate to send me a message via MyChart or call the office at [phone]. Thank you for visiting with [place] today! It's our pleasure caring for you.

## 2020-07-07 NOTE — Progress Notes (Signed)
Please call patient: xray shows large joint effusion as we expected. Could be cartilage or ligament injury/tear. Continue support sleeve and keep appt for next week with me.

## 2020-07-24 ENCOUNTER — Ambulatory Visit: Payer: 59 | Admitting: Family Medicine

## 2020-08-09 ENCOUNTER — Ambulatory Visit: Payer: 59 | Admitting: Family Medicine

## 2020-08-15 ENCOUNTER — Other Ambulatory Visit: Payer: Self-pay | Admitting: Family Medicine

## 2020-08-15 MED ORDER — DICLOFENAC SODIUM 75 MG PO TBEC
75.0000 mg | DELAYED_RELEASE_TABLET | Freq: Two times a day (BID) | ORAL | 0 refills | Status: DC
Start: 2020-08-15 — End: 2020-10-31

## 2020-08-31 ENCOUNTER — Ambulatory Visit (HOSPITAL_COMMUNITY): Payer: 59

## 2020-10-31 ENCOUNTER — Other Ambulatory Visit: Payer: Self-pay | Admitting: Family Medicine

## 2020-12-03 ENCOUNTER — Other Ambulatory Visit: Payer: Self-pay | Admitting: Family Medicine

## 2021-01-20 ENCOUNTER — Ambulatory Visit (HOSPITAL_COMMUNITY)
Admission: EM | Admit: 2021-01-20 | Discharge: 2021-01-20 | Disposition: A | Discharge status: Home / Self Care | Payer: Self-pay | Attending: Urgent Care | Admitting: Urgent Care

## 2021-01-20 ENCOUNTER — Other Ambulatory Visit: Payer: Self-pay

## 2021-01-20 ENCOUNTER — Encounter (HOSPITAL_COMMUNITY): Payer: Self-pay | Admitting: *Deleted

## 2021-01-20 ENCOUNTER — Ambulatory Visit (INDEPENDENT_AMBULATORY_CARE_PROVIDER_SITE_OTHER): Payer: Self-pay

## 2021-01-20 DIAGNOSIS — M25531 Pain in right wrist: Secondary | ICD-10-CM

## 2021-01-20 DIAGNOSIS — M778 Other enthesopathies, not elsewhere classified: Secondary | ICD-10-CM

## 2021-01-20 MED ORDER — DICLOFENAC SODIUM 75 MG PO TBEC: DELAYED_RELEASE_TABLET | ORAL | 0 refills | Status: DC

## 2021-01-20 NOTE — Discharge Instructions (Addendum)
Your symptoms sound consistent with extensor carpi ulnaris tendinitis. Please take your diclofenac twice a day, best taken with food to prevent an upset stomach.  Do not exceed 14 days of use. Please wear your wrist brace for 2 to 4 weeks depending on your response to treatment. Your x-ray was normal apart from arthritis to the base of your thumb.

## 2021-01-20 NOTE — ED Triage Notes (Addendum)
Denies any known injury. C/O right medial wrist pain and swelling onset yesterday. RUE CMS intact. Swelling extending into right hand; ring in place on right ring finger -- pt declines removal of ring.

## 2021-01-20 NOTE — ED Provider Notes (Signed)
[facility]    CSN: [id number] Arrival date & time: 01/20/21  1101      History   Chief Complaint Chief Complaint  Patient presents with   Wrist Pain    HPI [name] is a 56 y.o. female.   Pleasant 56 year old female presents today with an acute onset of right wrist pain.  She states the pain is significantly tender to touch and is located on the ulnar aspect of her right wrist.  She denies any known trauma.  She states she is currently unemployed.  She has a known history of carpal tunnel, status post carpal tunnel release 1 year ago, and osteoarthritis for which she used to take diclofenac.  Due to lack of insurance, she is no longer taking her diclofenac.  She did however have a few leftover and states she took 3 of them this morning.  Prior to taking the diclofenac, she was unable to move her wrist.  She states she noticed bruising and swelling over the ulnar aspect of her right wrist 2 days ago.  Apart from crocheting, she has not overused her wrist.  She feels at this time her entire hand is swollen, is unable to remove her ring due to the swelling.  She has full range of motion but states it feels "tight".   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   Bilateral carpal tunnel syndrome 06/24/2017   Primary osteoarthritis of first carpometacarpal joint of right hand 06/24/2017   Chronic low back pain 06/23/2012   COPD (chronic obstructive pulmonary disease) ([place]) 11/19/2010   Drug addiction in remission ([place]) 11/19/2010   Common migraine 03/23/2010   GERD (gastroesophageal reflux disease) 03/12/2010   Obesity (BMI 35.0-39.9 without comorbidity) 03/12/2010   Tobacco use disorder 12/11/2009   Major depression, recurrent ([place]) 03/03/2009    Past Surgical History:  Procedure Laterality Date   ABDOMINAL HYSTERECTOMY     CARPAL TUNNEL RELEASE Bilateral    11/2019   CERVICAL CONE BIOPSY  1996   Laser   LASER ABLATION OF THE CERVIX     MYRINGOTOMY     Bilateral, age 55 and 24   TONSILLECTOMY AND ADENOIDECTOMY     TOTAL VAGINAL HYSTERECTOMY  07/2007   TVH-menorrhagia, dysmenorrhea, still have fallopians tubes and ovaries   TUBAL LIGATION     Tubal Ligation/Steriization   VULVECTOMY N/A 09/25/2017   Procedure: PARTIAL VULVECTOMY;  Surgeon: Isabel Caprice, MD;  Location: [facility];  Service: Gynecology;  Laterality: N/A;   WISDOM TOOTH EXTRACTION      OB History   No obstetric history on file.      Home Medications    Prior to Admission medications   Medication Sig Start Date End Date Taking? Authorizing Provider  acetaminophen (TYLENOL) 325 MG tablet Take 650 mg by mouth every 6 (six) hours as needed.   Yes [provider]  ASPIRIN 81 PO Take by mouth.   Yes [provider]  diclofenac (VOLTAREN) 75 MG EC tablet Take one tab PO BID with food, do not exceed 14 days of use 01/20/21   Geryl Councilman L, PA    Family History Family History  Problem Relation Age of Onset    Heart failure Mother    Diabetes Mother    Cancer Father    COPD Maternal Grandmother    Emphysema Maternal Grandmother    Arthritis Maternal Grandmother    Lung cancer Maternal Grandfather    Breast cancer Paternal Grandmother    Tuberculosis Paternal Grandmother    Stroke Paternal Grandmother    Heart failure Paternal Grandfather    High blood pressure Paternal Grandfather    Colon cancer Neg Hx    Colon polyps Neg Hx    Esophageal cancer Neg Hx    Rectal cancer Neg Hx    Stomach cancer Neg Hx     Social History Social History   Tobacco Use   Smoking status: Every Day    Packs/day: 0.50    Years: 40.00    Pack years: 20.00    Types: Cigarettes   Smokeless tobacco: Never   Tobacco comments:    Patient smokes less than 1/2 pack of cigarettes a day  Vaping Use   Vaping Use: Never used  Substance Use Topics   Alcohol use: Yes    Comment: occasionally   Drug use: Not Currently    Types: Marijuana, "Crack" cocaine    Comment: 10 years clean     Allergies   Metronidazole and Latex   Review of Systems Review of Systems  Musculoskeletal:  Positive for arthralgias, joint swelling and myalgias. Negative for back pain, gait problem, neck pain and neck stiffness.       R wrist    Physical Exam Triage Vital Signs ED Triage Vitals  Enc Vitals Group     BP 01/20/21 1325 (!) 147/92     Pulse Rate 01/20/21 1325 81     Resp 01/20/21 1325 20     Temp 01/20/21 1325 98.2 F (36.8 C)     Temp Source 01/20/21 1325 Oral     SpO2 01/20/21 1325 95 %     Weight --      Height --      Head Circumference --      Peak Flow --      Pain Score 01/20/21 1327 4     Pain Loc --      Pain Edu? --      Excl. in [place]? --    No data found.  Updated Vital Signs BP (!) 147/92    Pulse 81    Temp 98.2 F (36.8 C) (Oral)    Resp 20    SpO2 95%   Visual Acuity Right Eye Distance:   Left Eye Distance:   Bilateral Distance:    Right Eye Near:   Left Eye Near:    Bilateral  Near:     Physical Exam Vitals and nursing note reviewed.  Constitutional:      General: She is not in acute distress.    Appearance: She is obese. She is not ill-appearing or toxic-appearing.  HENT:     Head: Normocephalic and atraumatic.  Cardiovascular:     Rate and Rhythm: Normal rate.  Musculoskeletal:     Right upper arm: Normal.     Left upper arm: Normal.     Right elbow: Normal. No swelling, deformity, effusion or lacerations. Normal range of motion. No tenderness.     Left elbow: Normal.     Right forearm: Swelling (mild swelling to distal ulna on R) and tenderness (to soft tissue surrounding wrist/ distal ulna on R) present. No deformity, lacerations or bony tenderness.     Left forearm: Normal. No swelling, edema, deformity, tenderness or bony tenderness.     Right wrist: Swelling (over TFCC region) and tenderness (over TFCC region) present. No deformity, lacerations, bony tenderness, snuff box tenderness or crepitus. Normal range of motion. Normal pulse.     Left wrist: Normal. No swelling, deformity, effusion, lacerations, tenderness, bony tenderness, snuff box tenderness or crepitus. Normal range of motion. Normal pulse.     Right hand: Swelling (entire hand equally swollen) present. No deformity, lacerations, tenderness or bony tenderness. Normal range of motion. Normal strength. Normal sensation.     Left hand: Normal. No swelling, deformity, lacerations, tenderness or bony tenderness. Normal range of motion. Normal strength. There is no disruption of two-point discrimination. Normal capillary refill. Normal pulse.  Skin:    General: Skin is warm.     Findings: Bruising (several areas of scattered ecchymosis and dorsal forearm petechiae) and erythema (palmar erythema bilaterally) present.  Neurological:     Mental Status: She is alert.     UC Treatments / Results  Labs (all labs ordered are listed, but only abnormal results are displayed) Labs Reviewed - No data to  display  EKG   Radiology DG Wrist Complete Right  Result Date: 01/20/2021 CLINICAL DATA:  Right wrist pain, no known injury EXAM: RIGHT WRIST - COMPLETE 3+ VIEW COMPARISON:  None. FINDINGS: There is no evidence of fracture or dislocation. The carpus is normally aligned. Mild thumb basal arthrosis with otherwise preserved joint spaces. Soft tissues are unremarkable. IMPRESSION: 1. No fracture or dislocation of the right wrist. The carpus is normally aligned. 2. Mild thumb basal arthrosis with otherwise preserved joint spaces. Electronically Signed   By: Delanna Ahmadi M.D.   On: 01/20/2021 14:21    Procedures Procedures (including critical care time)  Medications Ordered in UC Medications - No data to display  Initial Impression / Assessment and Plan / UC Course  I have reviewed the triage vital signs and the nursing notes.  Pertinent labs & imaging results that were available during my care of the patient were reviewed by me and considered in my medical decision making (see chart for details).     Wrist pain - the bruising appears to be superficial over a vein. Pt admits to easy bruising. Pt is not on a blood thinner. Supportive care, monitor Tendonitis - Xray does not reveal fracture or TFCC separation. Suspect tendonitis. Will do NSAIDs and wrist brace x 14 days.  Final Clinical Impressions(s) / UC Diagnoses   Final diagnoses:  Right wrist pain  Tendonitis of wrist, right     Discharge Instructions      Your symptoms sound consistent with extensor carpi ulnaris  tendinitis. Please take your diclofenac twice a day, best taken with food to prevent an upset stomach.  Do not exceed 14 days of use. Please wear your wrist brace for 2 to 4 weeks depending on your response to treatment. Your x-ray was normal apart from arthritis to the base of your thumb.     ED Prescriptions     Medication Sig Dispense Auth. Provider   diclofenac (VOLTAREN) 75 MG EC tablet Take one tab PO BID  with food, do not exceed 14 days of use 28 tablet Reighlynn Swiney L, PA      PDMP not reviewed this encounter.   Chaney Malling, [place] 01/20/21 1453

## 2021-05-15 ENCOUNTER — Encounter (INDEPENDENT_AMBULATORY_CARE_PROVIDER_SITE_OTHER): Payer: Self-pay

## 2021-05-16 ENCOUNTER — Other Ambulatory Visit: Payer: No Typology Code available for payment source

## 2021-05-16 ENCOUNTER — Ambulatory Visit (INDEPENDENT_AMBULATORY_CARE_PROVIDER_SITE_OTHER): Payer: No Typology Code available for payment source | Admitting: Gastroenterology

## 2021-05-16 ENCOUNTER — Encounter: Payer: Self-pay | Admitting: Gastroenterology

## 2021-05-16 VITALS — BP 142/80 | HR 81 | Ht 64.0 in | Wt 211.8 lb

## 2021-05-16 DIAGNOSIS — R195 Other fecal abnormalities: Secondary | ICD-10-CM

## 2021-05-16 DIAGNOSIS — R1013 Epigastric pain: Secondary | ICD-10-CM | POA: Diagnosis not present

## 2021-05-16 DIAGNOSIS — K219 Gastro-esophageal reflux disease without esophagitis: Secondary | ICD-10-CM

## 2021-05-16 DIAGNOSIS — R112 Nausea with vomiting, unspecified: Secondary | ICD-10-CM

## 2021-05-16 MED ORDER — OMEPRAZOLE 40 MG PO CPDR: 40.0000 mg | DELAYED_RELEASE_CAPSULE | Freq: Two times a day (BID) | ORAL | 5 refills | Status: DC

## 2021-05-16 NOTE — Progress Notes (Signed)
? ? ? ?05/16/2021 ?[name] ?[id number] ?Apr 12, 1965 ? ? ?HISTORY OF PRESENT ILLNESS: This is a 56 year old female who is a patient of Dr. Tarri Glenn.  She is here today with a few different issues.  First, she describes her bowel movements as being either mush or water.  She says she has no muscle control.  She often leaks stool and is afraid to pass gas.  Colonoscopy 2 years ago as below. ? ?Colonoscopy 05/2019: ? ?- Internal and external hemorrhoids. ?- Diverticulosis in the sigmoid colon and in the descending colon. ?- Two 1-2 mm polyps in the descending colon, removed with a cold snare. Resected and retrieved. ?- The examination was otherwise normal on direct and retroflexion views. ? ?Surgical [P], colon, descending, polyp (2) ?- TUBULAR ADENOMA, NEGATIVE FOR HIGH GRADE DYSPLASIA (X 1). ?- UNINVOLVED COLONIC MUCOSA (X 1). ? ?She also tells me that she has had stomach issues for a while.  Sometimes it hinders her from working.  She has a lot of epigastric abdominal pain that she feels is a pressure sensation especially if she eats a heavier meal.  She reports a lot of gas and bloating.  She says she has constant nausea.  She reports acid reflux and was on omeprazole in the past.  Occasionally she feels like food gets hung up.  Sometimes she gets sharp, stabbing pain.  She does take some diclofenac. ? ? ?Past Medical History:  ?Diagnosis Date  ? Anxiety   ? Arthritis of carpometacarpal (CMC) joint of right thumb   ? Bilateral carpal tunnel syndrome   ? Breast cyst, right 12/18/2007  ? 3 mm simple cyst at the 11 o'clock position  ? Cervical dysplasia   ? COPD (chronic obstructive pulmonary disease) ([place])   ? remote history of early symptoms  ? Drug addiction ([place])   ? 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 GERD (gastroesophageal reflux disease)   ? TUMS, OTC treatment as needed  ? HSV infection   ? Low back pain   ? Major depression   ? Migraines   ? Tubular adenoma of colon 06/16/2019  ? Colonoscopy  05/2019, Dr. Tarri Glenn, repeat in 7 years  ? VIN II (vulvar intraepithelial neoplasia II)   ? ?Past Surgical History:  ?Procedure Laterality Date  ? ABDOMINAL HYSTERECTOMY    ? CARPAL TUNNEL RELEASE Bilateral   ? 11/2019  ? CERVICAL CONE BIOPSY  1996  ? Laser  ? LASER ABLATION OF THE CERVIX    ? MYRINGOTOMY    ? Bilateral, age 21 and 43  ? TONSILLECTOMY AND ADENOIDECTOMY    ? TOTAL VAGINAL HYSTERECTOMY  07/2007  ? TVH-menorrhagia, dysmenorrhea, still have fallopians tubes and ovaries  ? TUBAL LIGATION    ? Tubal Ligation/Steriization  ? VULVECTOMY N/A 09/25/2017  ? Procedure: PARTIAL VULVECTOMY;  Surgeon: Isabel Caprice, MD;  [facility];  Service: Gynecology;  Laterality: N/A;  ? WISDOM TOOTH EXTRACTION    ? ? reports that she has been smoking cigarettes. She has a 20.00 pack-year smoking history. She has never used smokeless tobacco. She reports current alcohol use. She reports that she does not currently use drugs after having used the following drugs: Marijuana and "Crack" cocaine. ?family history includes Arthritis in her maternal grandmother; Breast cancer in her paternal grandmother; COPD in her maternal grandmother; Cancer in her father; Diabetes in her mother; Emphysema in her maternal grandmother; Heart failure in her mother and paternal grandfather; High blood  pressure in her paternal grandfather; Lung cancer in her maternal grandfather; Stroke in her paternal grandmother; Tuberculosis in her paternal grandmother. ?Allergies  ?Allergen Reactions  ? Metronidazole Itching  ? Latex Rash  ? ? ?  ?Outpatient Encounter Medications as of 05/16/2021  ?Medication Sig  ? acetaminophen (TYLENOL) 325 MG tablet Take 650 mg by mouth every 6 (six) hours as needed.  ? ASPIRIN 81 PO Take by mouth.  ? diclofenac (VOLTAREN) 75 MG EC tablet Take one tab PO BID with food, do not exceed 14 days of use  ? BREZTRI AEROSPHERE 160-9-4.8 MCG/ACT AERO SMARTSIG:2 Puff(s) By Mouth Morning-Night  ? ?No  facility-administered encounter medications on file as of 05/16/2021.  ? ? ? ?REVIEW OF SYSTEMS  : All other systems reviewed and negative except where noted in the History of Present Illness. ? ? ?PHYSICAL EXAM: ?BP (!) 142/80   Pulse 81   Ht '5\' 4"'$  (1.626 m)   Wt 211 lb 12.8 oz (96.1 kg)   SpO2 94%   BMI 36.36 kg/m?  ?General: Well developed white female in no acute distress ?Head: Normocephalic and atraumatic ?Eyes:  Sclerae anicteric, conjunctiva pink. ?Ears: Normal auditory acuity  ?Lungs: Clear throughout to auscultation; no W/R/R. ?Heart: Regular rate and rhythm; no M/R/G. ?Abdomen: Soft, non-distended.  BS present.  Mild diffuse TTP. ?Musculoskeletal: Symmetrical with no gross deformities  ?Skin: No lesions on visible extremities ?Extremities: No edema  ?Neurological: Alert oriented x 4, grossly non-focal ?Psychological:  Alert and cooperative. Normal mood and affect ? ?ASSESSMENT AND PLAN: ?*GERD, epigastric abdominal pain, nausea and vomiting: We will start omeprazole 40 mg twice daily.  We will plan for EGD with Dr. Tarri Glenn.  Take some diclofenac.  We discussed discontinuing that. ?*Loose stools: We will check celiac labs.  I have asked her to begin taking Benefiber powder starting with 2 teaspoons mixed in 8 ounces of liquid daily to try to bulk her stools.  We will check celiac labs. ? ?**The risks, benefits, and alternatives to EGD were discussed with the patient and she consents to proceed. ? ?CC:  Leamon Arnt, MD ? ?  ?

## 2021-05-16 NOTE — Patient Instructions (Addendum)
If you are age 54 or older, your body mass index should be between 23-30. Your Body mass index is 36.36 kg/m?Marland Kitchen If this is out of the aforementioned range listed, please consider follow up with your Primary Care Provider. ? ?If you are age 75 or younger, your body mass index should be between 19-25. Your Body mass index is 36.36 kg/m?Marland Kitchen If this is out of the aformentioned range listed, please consider follow up with your Primary Care Provider.  ? ?________________________________________________________ ? ?The [facility] providers would like to encourage you to use [facility] to communicate with providers for non-urgent requests or questions.  Due to long hold times on the telephone, sending your provider a message by [facility] may be a faster and more efficient way to get a response.  Please allow 48 business hours for a response.  Please remember that this is for non-urgent requests.  ?_______________________________________________________ ? ?You have been scheduled for an endoscopy. Please follow written instructions given to you at your visit today. ?If you use inhalers (even only as needed), please bring them with you on the day of your procedure. ? ?Your provider has requested that you go to the basement level for lab work before leaving today. Press "B" on the elevator. The lab is located at the first door on the left as you exit the elevator. ? ?Due to recent changes in healthcare laws, you may see the results of your imaging and laboratory studies on MyChart before your provider has had a chance to review them.  We understand that in some cases there may be results that are confusing or concerning to you. Not all laboratory results come back in the same time frame and the provider may be waiting for multiple results in order to interpret others.  Please give [place] 48 hours in order for your provider to thoroughly review all the results before contacting the office for clarification of your results.   ? ?Please start  Benefiber 2 tsp in 8 oz of juice or water daily.  ? ?It was a pleasure to see you today! ? ?Thank you for trusting me with your gastrointestinal care!   ? ? ?

## 2021-05-17 LAB — IGA: Immunoglobulin A: 146 mg/dL (ref 47–310)

## 2021-05-17 LAB — TISSUE TRANSGLUTAMINASE, IGA: (tTG) Ab, IgA: <1 U/mL

## 2021-05-20 ENCOUNTER — Other Ambulatory Visit: Payer: Self-pay | Admitting: Family Medicine

## 2021-05-25 ENCOUNTER — Encounter: Payer: Self-pay | Admitting: Gastroenterology

## 2021-05-25 DIAGNOSIS — R112 Nausea with vomiting, unspecified: Secondary | ICD-10-CM | POA: Insufficient documentation

## 2021-05-25 DIAGNOSIS — R195 Other fecal abnormalities: Secondary | ICD-10-CM | POA: Insufficient documentation

## 2021-05-25 DIAGNOSIS — R1013 Epigastric pain: Secondary | ICD-10-CM | POA: Insufficient documentation

## 2021-05-25 NOTE — Progress Notes (Signed)
Reviewed and agree with management plans. ? ?Burley Kopka L. Serai Tukes, MD, MPH  ?

## 2021-05-29 ENCOUNTER — Encounter: Payer: Self-pay | Admitting: Gastroenterology

## 2021-06-05 ENCOUNTER — Encounter: Payer: Self-pay | Admitting: Gastroenterology

## 2021-06-05 ENCOUNTER — Ambulatory Visit (AMBULATORY_SURGERY_CENTER): Payer: No Typology Code available for payment source | Admitting: Gastroenterology

## 2021-06-05 VITALS — BP 138/66 | HR 80 | Temp 98.4°F | Resp 12 | Ht 64.0 in | Wt 211.0 lb

## 2021-06-05 DIAGNOSIS — K2970 Gastritis, unspecified, without bleeding: Secondary | ICD-10-CM

## 2021-06-05 DIAGNOSIS — K219 Gastro-esophageal reflux disease without esophagitis: Secondary | ICD-10-CM

## 2021-06-05 DIAGNOSIS — K2980 Duodenitis without bleeding: Secondary | ICD-10-CM

## 2021-06-05 DIAGNOSIS — K2100 Gastro-esophageal reflux disease with esophagitis, without bleeding: Secondary | ICD-10-CM | POA: Diagnosis present

## 2021-06-05 DIAGNOSIS — K209 Esophagitis, unspecified without bleeding: Secondary | ICD-10-CM

## 2021-06-05 MED ORDER — SODIUM CHLORIDE 0.9 % IV SOLN: 500.0000 mL | Freq: Once | INTRAVENOUS | Status: DC

## 2021-06-05 NOTE — Op Note (Signed)
[place] Patient Name: [name] Procedure Date: 06/05/2021 3:11 PM MRN: [id number] Endoscopist: Thornton Park MD, MD Age: 56 Referring MD:  Date of Birth: October 15, 1965 Gender: Female Account #: [id number] Procedure:                Upper GI endoscopy Indications:              Abdominal pain, Abdominal bloating, Nausea Medicines:                Monitored Anesthesia Care Procedure:                Pre-Anesthesia Assessment:                           - Prior to the procedure, a History and Physical                            was performed, and patient medications and                            allergies were reviewed. The patient's tolerance of                            previous anesthesia was also reviewed. The risks                            and benefits of the procedure and the sedation                            options and risks were discussed with the patient.                            All questions were answered, and informed consent                            was obtained. Prior Anticoagulants: The patient has                            taken no previous anticoagulant or antiplatelet                            agents. ASA Grade Assessment: II - A patient with                            mild systemic disease. After reviewing the risks                            and benefits, the patient was deemed in                            satisfactory condition to undergo the procedure.                           After obtaining informed consent, the endoscope was  passed under direct vision. Throughout the                            procedure, the patient's blood pressure, pulse, and                            oxygen saturations were monitored continuously. The                            Endoscope was introduced through the mouth, and                            advanced to the third part of duodenum. The upper                            GI  endoscopy was accomplished without difficulty.                            The patient tolerated the procedure well. Scope In: Scope Out: Findings:                 LA Grade A (one or more mucosal breaks less than 5                            mm, not extending between tops of 2 mucosal folds)                            esophagitis with no bleeding was found 36 cm from                            the incisors. Biopsies were taken with a cold                            forceps for histology. Estimated blood loss was                            minimal.                           Localized mildly erythematous mucosa without                            bleeding was found in the gastric antrum. Biopsies                            were taken from the antrum, body, and fundus with a                            cold forceps for histology. Estimated blood loss                            was minimal.  Patchy mildly erythematous mucosa without active                            bleeding and with no stigmata of bleeding was found                            in the duodenal bulb. Biopsies were taken with a                            cold forceps for histology. Estimated blood loss                            was minimal.                           The cardia and gastric fundus were normal on                            retroflexion.                           The exam was otherwise without abnormality. Complications:            No immediate complications. Estimated Blood Loss:     Estimated blood loss was minimal. Impression:               - LA Grade A reflux esophagitis with no bleeding.                            Biopsied.                           - Erythematous mucosa in the antrum. Biopsied.                           - Erythematous duodenopathy. Biopsied.                           - The examination was otherwise normal. Recommendation:           - Patient has a contact number  available for                            emergencies. The signs and symptoms of potential                            delayed complications were discussed with the                            patient. Return to normal activities tomorrow.                            Written discharge instructions were provided to the                            patient.                           -  Resume previous diet.                           - Continue present medications including omeprazole                            40 mg BID.                           - Reflux lifestyle modifications.                           - Await pathology results.                           - No aspirin, ibuprofen, naproxen, diclofenac or                            other non-steroidal anti-inflammatory drugs. Thornton Park MD, MD 06/05/2021 3:32:16 PM This report has been signed electronically.

## 2021-06-05 NOTE — Progress Notes (Unsigned)
VS completed by DT.  Pt's states no medical or surgical changes since previsit or office visit.  

## 2021-06-05 NOTE — Progress Notes (Unsigned)
To pacu, VSS. Report to [email or web address] 

## 2021-06-05 NOTE — Progress Notes (Unsigned)
Indication for EGD: Reflux, epigastric abdominal pain, gas and bloating, nausea  Please see the 05/16/2021 office note for complete details.  There is been no change in history or physical exam since that time.  The patient remains an appropriate candidate for monitored anesthesia care in the endoscopy center.

## 2021-06-05 NOTE — Progress Notes (Unsigned)
Called to room to assist during endoscopic procedure.  Patient ID and intended procedure confirmed with present staff. Received instructions for my participation in the procedure from the performing physician.  

## 2021-06-05 NOTE — Patient Instructions (Signed)
YOU HAD AN ENDOSCOPIC PROCEDURE TODAY AT THE [facility]:   Refer to the procedure report that was given to you for any specific questions about what was found during the examination.  If the procedure report does not answer your questions, please call your gastroenterologist to clarify.  If you requested that your care partner not be given the details of your procedure findings, then the procedure report has been included in a sealed envelope for you to review at your convenience later.  YOU SHOULD EXPECT: Some feelings of bloating in the abdomen. Passage of more gas than usual.  Walking can help get rid of the air that was put into your GI tract during the procedure and reduce the bloating. If you had a lower endoscopy (such as a colonoscopy or flexible sigmoidoscopy) you may notice spotting of blood in your stool or on the toilet paper. If you underwent a bowel prep for your procedure, you may not have a normal bowel movement for a few days.  Please Note:  You might notice some irritation and congestion in your nose or some drainage.  This is from the oxygen used during your procedure.  There is no need for concern and it should clear up in a day or so.  SYMPTOMS TO REPORT IMMEDIATELY:    Following upper endoscopy (EGD)  Vomiting of blood or coffee ground material  New chest pain or pain under the shoulder blades  Painful or persistently difficult swallowing  New shortness of breath  Fever of 100F or higher  Black, tarry-looking stools  For urgent or emergent issues, a gastroenterologist can be reached at any hour by calling [phone]. Do not use MyChart messaging for urgent concerns.    DIET:  We do recommend a small meal at first, but then you may proceed to your regular diet.  Drink plenty of fluids but you should avoid alcoholic beverages for 24 hours.  ACTIVITY:  You should plan to take it easy for the rest of today and you should NOT DRIVE or use heavy machinery  until tomorrow (because of the sedation medicines used during the test).    FOLLOW UP: Our staff will call the number listed on your records 48-72 hours following your procedure to check on you and address any questions or concerns that you may have regarding the information given to you following your procedure. If we do not reach you, we will leave a message.  We will attempt to reach you two times.  During this call, we will ask if you have developed any symptoms of COVID 19. If you develop any symptoms (ie: fever, flu-like symptoms, shortness of breath, cough etc.) before then, please call [phone].  If you test positive for Covid 19 in the 2 weeks post procedure, please call and report this information to us.    If any biopsies were taken you will be contacted by phone or by letter within the next 1-3 weeks.  Please call us at [phone] if you have not heard about the biopsies in 3 weeks.    SIGNATURES/CONFIDENTIALITY: You and/or your care partner have signed paperwork which will be entered into your electronic medical record.  These signatures attest to the fact that that the information above on your After Visit Summary has been reviewed and is understood.  Full responsibility of the confidentiality of this discharge information lies with you and/or your care-partner. 

## 2021-06-06 ENCOUNTER — Telehealth: Payer: Self-pay

## 2021-06-06 NOTE — Telephone Encounter (Signed)
  Follow up Call-     06/05/2021    1:56 PM 06/08/2019    2:31 PM  Call back number  Post procedure Call Back phone  # [phone]  Permission to leave phone message Yes Yes     Patient questions:  Do you have a fever, pain , or abdominal swelling? No. Pain Score  0 *  Have you tolerated food without any problems? Yes.    Have you been able to return to your normal activities? Yes.    Do you have any questions about your discharge instructions: Diet   No. Medications  No. Follow up visit  No.  Do you have questions or concerns about your Care? No.  Actions: * If pain score is 4 or above: No action needed, pain <4.  States still having the same "tore up tummy pain" she had been having but no other problems

## 2021-06-08 ENCOUNTER — Encounter: Payer: Self-pay | Admitting: Gastroenterology

## 2021-07-09 ENCOUNTER — Encounter: Payer: Self-pay | Admitting: Physician Assistant

## 2021-07-09 ENCOUNTER — Ambulatory Visit (INDEPENDENT_AMBULATORY_CARE_PROVIDER_SITE_OTHER): Payer: No Typology Code available for payment source | Admitting: Physician Assistant

## 2021-07-09 VITALS — BP 140/70 | HR 67 | Ht 64.0 in | Wt 213.0 lb

## 2021-07-09 DIAGNOSIS — K219 Gastro-esophageal reflux disease without esophagitis: Secondary | ICD-10-CM | POA: Diagnosis not present

## 2021-07-09 DIAGNOSIS — R195 Other fecal abnormalities: Secondary | ICD-10-CM

## 2021-07-09 NOTE — Progress Notes (Signed)
Reviewed and agree with management plans. ? ?Flora Parks L. Addi Pak, MD, MPH  ?

## 2021-07-27 ENCOUNTER — Encounter: Payer: Self-pay | Admitting: *Deleted

## 2021-08-22 ENCOUNTER — Ambulatory Visit (INDEPENDENT_AMBULATORY_CARE_PROVIDER_SITE_OTHER): Payer: No Typology Code available for payment source | Admitting: Cardiovascular Disease

## 2021-08-22 ENCOUNTER — Encounter: Payer: Self-pay | Admitting: Cardiovascular Disease

## 2021-08-22 VITALS — BP 138/95 | HR 72 | Ht 64.0 in | Wt 211.2 lb

## 2021-08-22 DIAGNOSIS — R072 Precordial pain: Secondary | ICD-10-CM | POA: Diagnosis not present

## 2021-08-22 DIAGNOSIS — R0609 Other forms of dyspnea: Secondary | ICD-10-CM

## 2021-08-22 MED ORDER — METOPROLOL TARTRATE 100 MG PO TABS
100.0000 mg | ORAL_TABLET | Freq: Once | ORAL | 0 refills | Status: DC
Start: 2021-08-22 — End: 2022-04-23

## 2021-08-22 MED ORDER — POTASSIUM CHLORIDE CRYS ER 10 MEQ PO TBCR: 10.0000 meq | EXTENDED_RELEASE_TABLET | Freq: Two times a day (BID) | ORAL | 3 refills | Status: DC

## 2021-08-22 MED ORDER — HYDROCHLOROTHIAZIDE 25 MG PO TABS: 25.0000 mg | ORAL_TABLET | Freq: Every day | ORAL | 3 refills | Status: DC

## 2021-08-22 MED ORDER — VALSARTAN 80 MG PO TABS: 80.0000 mg | ORAL_TABLET | Freq: Every day | ORAL | 3 refills | Status: DC

## 2021-08-22 NOTE — Progress Notes (Unsigned)
Cardiology Office Note:    Date:  08/22/2021   ID:  [name], DOB 12-18-1965, MRN [id number]  PCP:  Leamon Arnt, [place] Providers Cardiologist:  None { Click to update primary MD,subspecialty MD or APP then REFRESH:1}    Referring MD: Armanda Heritage, NP   Chief Complaint  Patient presents with   Chest Pain   Hypertension       ***  History of Present Illness:    [name] is a 56 y.o. female with a hx of marked HTN  HTN for several months. Presented for evaluation when she developed headaches behind her ears.  Blood pressure was markedly elevated.  She was started on lisinopril HCTZ.  This is not lower her blood pressure very much.  Has chronic nausea and vomiting.  At the same time, she has episodes of orthostatic hypotension   Feels weird, goes gray, lightheaded , nauseated, feels like she is going to pass out.   Has these about twice amonth Lasts for 5-20 minutes  Has been going on since she was a teenager .   Still smokes  Eats bacon , sausage on the weekends PB sandwich for lunch Eats canned veggies on occasion   + family hx of HTN Mother has CHF , Paternal grandfather passed away of Mi at age 52 Father passed away this past 2022-04-24  He had HTN  Has intense pressure in her chest with eating  Has had EGD,     Past Medical History:  Diagnosis Date   Anxiety    Arthritis of carpometacarpal [facility]) joint of right thumb    Bilateral carpal tunnel syndrome    Breast cyst, right 12/18/2007   3 mm simple cyst at the 11 o'clock position   Cervical dysplasia    COPD (chronic obstructive pulmonary disease) ([place])    remote history of early symptoms   Drug addiction ([place])    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 Tarri Glenn, repeat in 7 years   VIN II (vulvar intraepithelial neoplasia II)     Past Surgical History:  Procedure Laterality Date   ABDOMINAL HYSTERECTOMY     CARPAL TUNNEL RELEASE Bilateral    11/2019   CERVICAL CONE BIOPSY  1996   Laser   COLONOSCOPY     LASER ABLATION OF THE CERVIX     MYRINGOTOMY     Bilateral, age 52 and 54   TONSILLECTOMY AND ADENOIDECTOMY     TOTAL VAGINAL HYSTERECTOMY  07/2007   TVH-menorrhagia, dysmenorrhea, still have fallopians tubes and ovaries   TUBAL LIGATION     Tubal Ligation/Steriization   VULVECTOMY N/A 09/25/2017   Procedure: PARTIAL VULVECTOMY;  Surgeon: Isabel Caprice, MD;  Location: [place];  Service: Gynecology;  Laterality: N/A;   WISDOM TOOTH EXTRACTION      Current Medications: Current Meds  Medication Sig   acetaminophen (TYLENOL) 325 MG tablet Take 650 mg by mouth every 6 (six) hours as needed.   lisinopril-hydrochlorothiazide (ZESTORETIC) 10-12.5 MG tablet Take 1 tablet by mouth daily.   omeprazole (PRILOSEC) 40 MG capsule Take 1 capsule (40 mg total) by mouth 2 (two) times daily.   ondansetron (ZOFRAN-ODT) 4  MG disintegrating tablet Take 1 mg by mouth every 8 (eight) hours as needed for nausea or vomiting.     Allergies:   Metronidazole and Latex   Social History   Socioeconomic History   Marital status: Significant Other    Spouse name: Not on file   Number of children: Not on file   Years of education: Not on file   Highest education level: Associate degree: occupational, Hotel manager, or vocational program  Occupational History   Occupation: customer service representative  Tobacco Use   Smoking status: Former    Packs/day: 0.50    Years: 40.00    Total pack years: 20.00    Types: Cigarettes    Quit date: 04/09/2021    Years since quitting: 0.3   Smokeless tobacco: Never   Tobacco comments:    Patient smokes less than 1/2 pack of cigarettes a day  Vaping Use   Vaping Use: Never used   Substance and Sexual Activity   Alcohol use: Yes    Comment: occasionally   Drug use: Not Currently    Types: Marijuana, "Crack" cocaine    Comment: 20 years clean per pt   Sexual activity: Yes    Partners: Male    Birth control/protection: Surgical  Other Topics Concern   Not on file  Social History Narrative   Not on file   Social Determinants of Health   Financial Resource Strain: Medium Risk (05/30/2020)   Overall Financial Resource Strain (CARDIA)    Difficulty of Paying Living Expenses: Somewhat hard  Food Insecurity: Food Insecurity Present (05/30/2020)   Hunger Vital Sign    Worried About Running Out of Food in the Last Year: Sometimes true    Ran Out of Food in the Last Year: Never true  Transportation Needs: No Transportation Needs (05/30/2020)   PRAPARE - Hydrologist (Medical): No    Lack of Transportation (Non-Medical): No  Physical Activity: Unknown (05/30/2020)   Exercise Vital Sign    Days of Exercise per Week: 0 days    Minutes of Exercise per Session: Not on file  Stress: Stress Concern Present (05/30/2020)   [place]    Feeling of Stress : Rather much  Social Connections: Moderately Isolated (05/30/2020)   Social Connection and Isolation Panel [NHANES]    Frequency of Communication with Friends and Family: More than three times a week    Frequency of Social Gatherings with Friends and Family: Twice a week    Attends Religious Services: Never    Marine scientist or Organizations: No    Attends Music therapist: Not on file    Marital Status: Living with partner     Family History: The patient's ***family history includes Arthritis in her maternal grandmother; Breast cancer in her paternal grandmother; COPD in her maternal grandmother; Cancer in her father; Diabetes in her mother; Emphysema in her maternal grandmother; Heart failure in her mother  and paternal grandfather; High blood pressure in her paternal grandfather; Lung cancer in her maternal grandfather; Stroke in her paternal grandmother; Tuberculosis in her paternal grandmother. There is no history of Colon cancer, Colon polyps, Esophageal cancer, Rectal cancer, or Stomach cancer.  ROS:   Please see the history of present illness.    *** All other systems reviewed and are negative.  EKGs/Labs/Other Studies Reviewed:    The following studies were reviewed today: ***  EKG: August 22, 2021: Normal sinus  rhythm at 72.  Poor R wave progression-likely due to lead placement  Recent Labs: No results found for requested labs within last 365 days.  Recent Lipid Panel    Component Value Date/Time   CHOL 184 09/09/2019 1141   TRIG 212 (H) 09/09/2019 1141   HDL 49 (L) 09/09/2019 1141   CHOLHDL 3.8 09/09/2019 1141   LDLCALC 102 (H) 09/09/2019 1141     Risk Assessment/Calculations:   {Does this patient have ATRIAL FIBRILLATION?[phone]}       Physical Exam:    VS:  BP (!) 138/95   Pulse 72   Ht '5\' 4"'$  (1.626 m)   Wt 211 lb 3.2 oz (95.8 kg)   SpO2 98%   BMI 36.25 kg/m     Wt Readings from Last 3 Encounters:  08/22/21 211 lb 3.2 oz (95.8 kg)  07/09/21 213 lb (96.6 kg)  06/05/21 211 lb (95.7 kg)     GEN: *** Well nourished, well developed in no acute distress HEENT: Normal NECK: No JVD; No carotid bruits LYMPHATICS: No lymphadenopathy CARDIAC: ***RRR, no murmurs, rubs, gallops RESPIRATORY:  Clear to auscultation without rales, wheezing or rhonchi  ABDOMEN: Soft, non-tender, non-distended MUSCULOSKELETAL:  No edema; No deformity  SKIN: Warm and dry NEUROLOGIC:  Alert and oriented x 3 PSYCHIATRIC:  Normal affect   ASSESSMENT:    No diagnosis found. PLAN:    In order of problems listed above:  Hypertension: Brighid presents with longstanding hypertension.  She eats salty foods on occasion.  She continues to smoke.  She is carrying some extra weight and she  is not active.  I encouraged her to stop smoking.  Will discontinue the lisinopril HCTZ.  Will start her on valsartan 80 mg a day and HCTZ 25 mg a day, potassium chloride 10 mill equivalents a day. Of asked her to exercise on a regular basis. She will work on weight loss.  I will see her again in 1 month for follow-up visit.  Will continue to gradually titrate up her medications.   2.  Hyperlipidemia: Her lipid levels are only minimally elevated.  She does have a family history of coronary artery disease.  Will discuss this at a later time.  Shortness of breath with exertion: She has sudden episodes of severe shortness of breath with exertion associated with nausea.  I cannot tell whether these are episodes of hypertension.  Some aspects of the symptoms sound like unstable angina.  Will get a coronary CT angiogram for further evaluation.   Will see her again in 1 month with a basic metabolic profile.  Will continue to titrate medications up at that time.      {Are you ordering a CV Procedure (e.g. stress test, cath, DCCV, TEE, etc)?   Press F2        :[id number]}    Medication Adjustments/Labs and Tests Ordered: Current medicines are reviewed at length with the patient today.  Concerns regarding medicines are outlined above.  No orders of the defined types were placed in this encounter.  No orders of the defined types were placed in this encounter.   There are no Patient Instructions on file for this visit.   Signed, Mertie Moores, MD  08/22/2021 4:45 PM    [place]

## 2021-08-22 NOTE — Patient Instructions (Signed)
Medication Instructions:  Valsartan '80mg'$  daily Hydrochlorothiazide '25mg'$  daily K-dur ( potassium) 10 meq daily Stop taking lisinopril/ HCTZ *If you need a refill on your cardiac medications before your next appointment, please call your pharmacy*   Lab Work: Bmet today Bmet one month If you have labs (blood work) drawn today and your tests are completely normal, you will receive your results only by: [employer] (if you have MyChart) OR A paper copy in the mail If you have any lab test that is abnormal or we need to change your treatment, we will call you to review the results.   Testing/Procedures: ECHO Your physician has requested that you have an echocardiogram. Echocardiography is a painless test that uses sound waves to create images of your heart. It provides your doctor with information about the size and shape of your heart and how well your heart's chambers and valves are working. This procedure takes approximately one hour. There are no restrictions for this procedure.   Cardiac CT scan    Follow-Up: At [facility], you and your health needs are our priority.  As part of our continuing mission to provide you with exceptional heart care, we have created designated Provider Care Teams.  These Care Teams include your primary Cardiologist (physician) and Advanced Practice Providers (APPs -  Physician Assistants and Nurse Practitioners) who all work together to provide you with the care you need, when you need it.  We recommend signing up for the patient portal called "MyChart".  Sign up information is provided on this After Visit Summary.  MyChart is used to connect with patients for Virtual Visits (Telemedicine).  Patients are able to view lab/test results, encounter notes, upcoming appointments, etc.  Non-urgent messages can be sent to your provider as well.   To learn more about what you can do with MyChart, go to [email or web address].    Your next appointment:   1  month(s)  The format for your next appointment:   In Person  Provider:   With Dr Acie Fredrickson if available, if not an APP  Other Instructions    Your cardiac CT will be scheduled at one of the below locations:   [facility] [address] [place], [place] [address] [phone]  If scheduled at [facility], please arrive at the [facility] and Children's Entrance (Entrance C2) of [facility] 30 minutes prior to test start time. You can use the FREE valet parking offered at entrance C (encouraged to control the heart rate for the test)  Proceed to the [facility] Radiology Department (first floor) to check-in and test prep.  All radiology patients and guests should use entrance C2 at [facility], accessed from [facility], even though the hospital's physical address listed is [address].   Please follow these instructions carefully (unless otherwise directed):   On the Night Before the Test: Be sure to Drink plenty of water. Do not consume any caffeinated/decaffeinated beverages or chocolate 12 hours prior to your test. Do not take any antihistamines 12 hours prior to your test.   On the Day of the Test: Drink plenty of water until 1 hour prior to the test. Do not eat any food 4 hours prior to the test. You may take your regular medications prior to the test.  Take metoprolol (Lopressor) two hours prior to test. HOLD Furosemide/Hydrochlorothiazide morning of the test. FEMALES- please wear underwire-free bra if available, avoid dresses & tight clothing  After the Test: Drink plenty of water. After receiving IV contrast, you may experience a mild flushed feeling. This is normal. On occasion, you may experience a mild rash up to 24 hours after the test. This is not dangerous. If this occurs, you can take Benadryl 25 mg and increase your fluid intake. If you experience trouble breathing, this can be serious. If it is  severe call 911 IMMEDIATELY. If it is mild, please call our office. If you take any of these medications: Glipizide/Metformin, Avandament, Glucavance, please do not take 48 hours after completing test unless otherwise instructed.  We will call to schedule your test 2-4 weeks out understanding that some insurance companies will need an authorization prior to the service being performed.   For non-scheduling related questions, please contact the cardiac imaging nurse navigator should you have any questions/concerns: Marchia Bond, Cardiac Imaging Nurse Navigator Gordy Clement, Cardiac Imaging Nurse Navigator [facility] Direct Office Dial: [phone]   For scheduling needs, including cancellations and rescheduling, please call [place], [phone].

## 2021-08-24 ENCOUNTER — Other Ambulatory Visit: Payer: Self-pay

## 2021-08-24 DIAGNOSIS — R072 Precordial pain: Secondary | ICD-10-CM

## 2021-08-25 LAB — BASIC METABOLIC PANEL
BUN/Creatinine Ratio: 11 (ref 9–23)
BUN: 11 mg/dL (ref 6–24)
CO2: 21 mmol/L (ref 20–29)
Calcium: 9.8 mg/dL (ref 8.7–10.2)
Chloride: 104 mmol/L (ref 96–106)
Creatinine, Ser: 0.99 mg/dL (ref 0.57–1.00)
Glucose: 103 mg/dL — ABNORMAL HIGH (ref 70–99)
Potassium: 4.4 mmol/L (ref 3.5–5.2)
Sodium: 144 mmol/L (ref 134–144)
eGFR: 68 mL/min/{1.73_m2} (ref >59)

## 2021-09-07 ENCOUNTER — Ambulatory Visit (HOSPITAL_COMMUNITY): Payer: No Typology Code available for payment source | Attending: Cardiology

## 2021-09-07 DIAGNOSIS — R072 Precordial pain: Secondary | ICD-10-CM | POA: Diagnosis present

## 2021-09-07 LAB — ECHOCARDIOGRAM COMPLETE
Area-P 1/2: 4.17 cm2
Calc EF: 49.8 %
S' Lateral: 3.65 cm
Single Plane A2C EF: 51.8 %
Single Plane A4C EF: 45.6 %

## 2021-09-10 ENCOUNTER — Encounter: Payer: Self-pay | Admitting: Cardiovascular Disease

## 2021-09-14 ENCOUNTER — Other Ambulatory Visit (HOSPITAL_COMMUNITY): Payer: No Typology Code available for payment source

## 2021-09-20 ENCOUNTER — Encounter (HOSPITAL_COMMUNITY): Payer: Self-pay | Admitting: Emergency Medicine

## 2021-09-20 ENCOUNTER — Ambulatory Visit (HOSPITAL_COMMUNITY)
Admission: EM | Admit: 2021-09-20 | Discharge: 2021-09-20 | Disposition: A | Discharge status: Home / Self Care | Payer: No Typology Code available for payment source | Attending: Internal Medicine | Admitting: Internal Medicine

## 2021-09-20 ENCOUNTER — Other Ambulatory Visit: Payer: Self-pay

## 2021-09-20 ENCOUNTER — Ambulatory Visit (INDEPENDENT_AMBULATORY_CARE_PROVIDER_SITE_OTHER): Payer: No Typology Code available for payment source

## 2021-09-20 DIAGNOSIS — J441 Chronic obstructive pulmonary disease with (acute) exacerbation: Secondary | ICD-10-CM | POA: Diagnosis not present

## 2021-09-20 DIAGNOSIS — R059 Cough, unspecified: Secondary | ICD-10-CM | POA: Diagnosis not present

## 2021-09-20 DIAGNOSIS — R062 Wheezing: Secondary | ICD-10-CM

## 2021-09-20 MED ORDER — ALBUTEROL SULFATE HFA 108 (90 BASE) MCG/ACT IN AERS: 1.0000 | INHALATION_SPRAY | Freq: Four times a day (QID) | RESPIRATORY_TRACT | 0 refills | Status: DC | PRN

## 2021-09-20 MED ORDER — DOXYCYCLINE HYCLATE 100 MG PO CAPS: 100.0000 mg | ORAL_CAPSULE | Freq: Two times a day (BID) | ORAL | 0 refills | Status: AC

## 2021-09-20 MED ORDER — PREDNISONE 20 MG PO TABS: 40.0000 mg | ORAL_TABLET | Freq: Every day | ORAL | 0 refills | Status: AC

## 2021-09-20 NOTE — ED Triage Notes (Signed)
Cough started on week ago.  Minimal production with cough.  Complains of shortness of breath and headache and fever.

## 2021-09-20 NOTE — Discharge Instructions (Addendum)
Please take medications as prescribed Smoke cessation is advised Maintain adequate hydration Your chest x-ray is negative for pneumonia Return to urgent care if symptoms worsen.

## 2021-09-20 NOTE — ED Provider Notes (Signed)
[place]    CSN: [id number] Arrival date & time: 09/20/21  1649      History   Chief Complaint Chief Complaint  Patient presents with   Cough    HPI [name] is a 56 y.o. female comes to the urgent care with 1 week history of cough, shortness of breath and feeling fatigued.  Patient was exposed to her grandson who had respiratory symptoms about a week or so ago.  Patient started experiencing runny nose, nasal congestion, cough about a week ago.  Over the course of the week the cough has persisted and she is experiencing shortness of breath on exertion at this time.  She denies any fever or chills.  No nausea, vomiting or diarrhea.  No dizziness, near syncope or syncopal episode.  Cough is productive of tan sputum.  Patient has a 20-pack-year history of smoking.  She denies any wheezing.  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 Tarri Glenn, repeat in 7 years   VIN II (vulvar intraepithelial neoplasia II)     Patient Active Problem List   Diagnosis Date Noted   Abdominal pain, epigastric 05/25/2021   Nausea and vomiting 05/25/2021   Loose stools 05/25/2021   Menopausal symptoms 07/05/2020   Facet arthritis of cervical region 09/09/2019   Tubular adenoma of colon 06/16/2019   Urinary incontinence 04/09/2019   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   Bilateral carpal  tunnel syndrome 06/24/2017   Primary osteoarthritis of first carpometacarpal joint of right hand 06/24/2017   Chronic low back pain 06/23/2012   COPD (chronic obstructive pulmonary disease) ([place]) 11/19/2010   Drug addiction in remission ([place]) 11/19/2010   Common migraine 03/23/2010   GERD (gastroesophageal reflux disease) 03/12/2010   Obesity (BMI 35.0-39.9 without comorbidity) 03/12/2010   Tobacco use disorder 12/11/2009   Major depression, recurrent ([place]) 03/03/2009    Past Surgical History:  Procedure Laterality Date   ABDOMINAL HYSTERECTOMY     CARPAL TUNNEL RELEASE Bilateral    11/2019   CERVICAL CONE BIOPSY  1996   Laser   COLONOSCOPY     LASER ABLATION OF THE CERVIX     MYRINGOTOMY     Bilateral, age 89 and 89   TONSILLECTOMY AND ADENOIDECTOMY     TOTAL VAGINAL HYSTERECTOMY  07/2007   TVH-menorrhagia, dysmenorrhea, still have fallopians tubes and ovaries   TUBAL LIGATION     Tubal Ligation/Steriization   VULVECTOMY N/A 09/25/2017   Procedure: PARTIAL VULVECTOMY;  Surgeon: Isabel Caprice, MD;  Location: [place];  Service: Gynecology;  Laterality: N/A;   WISDOM TOOTH EXTRACTION      OB History   No obstetric history on file.      Home Medications    Prior to Admission medications  Medication Sig Start Date End Date Taking? Authorizing Provider  albuterol (VENTOLIN HFA) 108 (90 Base) MCG/ACT inhaler Inhale 1 puff into the lungs every 6 (six) hours as needed for wheezing or shortness of breath. 09/20/21  Yes Kaci Dillie, Myrene Galas, MD  doxycycline (VIBRAMYCIN) 100 MG capsule Take 1 capsule (100 mg total) by mouth 2 (two) times daily for 7 days. 09/20/21 09/27/21 Yes Phillippa Straub, Myrene Galas, MD  predniSONE (DELTASONE) 20 MG tablet Take 2 tablets (40 mg total) by mouth daily for 5 days. 09/20/21 09/25/21 Yes Kaysea Raya, Myrene Galas, MD  acetaminophen (TYLENOL) 325 MG tablet Take 650 mg by mouth every 6 (six) hours as needed.    [provider]  hydrochlorothiazide  (HYDRODIURIL) 25 MG tablet Take 1 tablet (25 mg total) by mouth daily. 08/22/21   Nahser, Wonda Cheng, MD  metoprolol tartrate (LOPRESSOR) 100 MG tablet Take 1 tablet (100 mg total) by mouth once for 1 dose. Take 1 tablet (100 mg total) two hours prior to CT scan. 08/22/21 08/22/21  Nahser, Wonda Cheng, MD  omeprazole (PRILOSEC) 40 MG capsule Take 1 capsule (40 mg total) by mouth 2 (two) times daily. 05/16/21   Zehr, Janett Billow D, PA-C  ondansetron (ZOFRAN-ODT) 4 MG disintegrating tablet Take 1 mg by mouth every 8 (eight) hours as needed for nausea or vomiting.    [provider]  potassium chloride (KLOR-CON M) 10 MEQ tablet Take 1 tablet (10 mEq total) by mouth 2 (two) times daily. 08/22/21   Nahser, Wonda Cheng, MD  valsartan (DIOVAN) 80 MG tablet Take 1 tablet (80 mg total) by mouth daily. 08/22/21   Nahser, Wonda Cheng, MD    Family History Family History  Problem Relation Age of Onset   Heart failure Mother    Diabetes Mother    Cancer Father    COPD Maternal Grandmother    Emphysema Maternal Grandmother    Arthritis Maternal Grandmother    Lung cancer Maternal Grandfather    Breast cancer Paternal Grandmother    Tuberculosis Paternal Grandmother    Stroke Paternal Grandmother    Heart failure Paternal Grandfather    High blood pressure Paternal Grandfather    Colon cancer Neg Hx    Colon polyps Neg Hx    Esophageal cancer Neg Hx    Rectal cancer Neg Hx    Stomach cancer Neg Hx     Social History Social History   Tobacco Use   Smoking status: Some Days    Packs/day: 0.50    Years: 40.00    Total pack years: 20.00    Types: Cigarettes    Last attempt to quit: 04/09/2021    Years since quitting: 0.4   Smokeless tobacco: Never   Tobacco comments:    Patient smokes less than 1/2 pack of cigarettes a day  Vaping Use   Vaping Use: Never used  Substance Use Topics   Alcohol use: Yes    Comment: occasionally   Drug use: Not Currently    Types: Marijuana, "Crack" cocaine    Comment: 20  years clean per pt     Allergies   Metronidazole and Latex   Review of Systems Review of Systems As per HPI  Physical Exam Triage Vital Signs ED Triage Vitals  Enc Vitals Group     BP 09/20/21 1723 127/80     Pulse Rate 09/20/21 1723 84     Resp 09/20/21 1723 20     Temp 09/20/21 1723 98.8 F (37.1 C)  Temp Source 09/20/21 1723 Oral     SpO2 09/20/21 1723 95 %     Weight --      Height --      Head Circumference --      Peak Flow --      Pain Score 09/20/21 1721 0     Pain Loc --      Pain Edu? --      Excl. in [place]? --    No data found.  Updated Vital Signs BP 127/80 (BP Location: Left Arm)   Pulse 84   Temp 98.8 F (37.1 C) (Oral)   Resp 20   SpO2 95%   Visual Acuity Right Eye Distance:   Left Eye Distance:   Bilateral Distance:    Right Eye Near:   Left Eye Near:    Bilateral Near:     Physical Exam Vitals and nursing note reviewed.  Constitutional:      General: She is not in acute distress.    Appearance: She is not ill-appearing.  HENT:     Right Ear: Tympanic membrane normal.     Left Ear: Tympanic membrane normal.     Nose: Nose normal.  Cardiovascular:     Rate and Rhythm: Normal rate and regular rhythm.     Pulses: Normal pulses.     Heart sounds: Normal heart sounds.  Pulmonary:     Effort: Pulmonary effort is normal.     Breath sounds: Wheezing and rhonchi present.  Abdominal:     General: Bowel sounds are normal.     Palpations: Abdomen is soft.  Musculoskeletal:        General: Normal range of motion.  Skin:    General: Skin is warm.  Neurological:     General: No focal deficit present.     Mental Status: She is alert and oriented to person, place, and time.         UC Treatments / Results  Labs (all labs ordered are listed, but only abnormal results are displayed) Labs Reviewed - No data to display  EKG   Radiology DG Chest 2 View  Result Date: 09/20/2021 CLINICAL DATA:  cough and wheezing. 20pack year of  smoking EXAM: CHEST - 2 VIEW COMPARISON:  None Available. FINDINGS: The lungs are hyperinflated. There is central bronchial thickening. Normal heart size and mediastinal contours. Minimal streaky atelectasis in the lingula. No confluent consolidation. No pulmonary edema. No pleural fluid or pneumothorax. No visualized pulmonary mass or nodule. No acute osseous abnormalities are seen. IMPRESSION: Hyperinflation with central bronchial thickening. This may represent asthma, bronchitis, or smoking related lung disease. Electronically Signed   By: Keith Rake M.D.   On: 09/20/2021 18:05    Procedures Procedures (including critical care time)  Medications Ordered in UC Medications - No data to display  Initial Impression / Assessment and Plan / UC Course  I have reviewed the triage vital signs and the nursing notes.  Pertinent labs & imaging results that were available during my care of the patient were reviewed by me and considered in my medical decision making (see chart for details).     1.  COPD with acute exacerbation: Chest x-ray is negative for acute lung infiltrate significant for hyperinflation with central bronchial thickening. Prednisone 20 mg orally daily for 5 days Doxycycline 100 mg twice daily for 5 days Albuterol inhaler as needed for chest tightness Maintain adequate hydration COVID-19 testing is not indicated given the duration of symptoms.  Patient  is currently day 7 to day 8 of symptoms. Return to urgent care if symptoms worsen. Final Clinical Impressions(s) / UC Diagnoses   Final diagnoses:  COPD with acute exacerbation [facility])     Discharge Instructions      Please take medications as prescribed Smoke cessation is advised Maintain adequate hydration Your chest x-ray is negative for pneumonia Return to urgent care if symptoms worsen.     ED Prescriptions     Medication Sig Dispense Auth. Provider   albuterol (VENTOLIN HFA) 108 (90 Base) MCG/ACT inhaler  Inhale 1 puff into the lungs every 6 (six) hours as needed for wheezing or shortness of breath. 6.7 g Nhung Danko, Myrene Galas, MD   doxycycline (VIBRAMYCIN) 100 MG capsule Take 1 capsule (100 mg total) by mouth 2 (two) times daily for 7 days. 14 capsule Vielka Klinedinst, Myrene Galas, MD   predniSONE (DELTASONE) 20 MG tablet Take 2 tablets (40 mg total) by mouth daily for 5 days. 10 tablet Aoki Wedemeyer, Myrene Galas, MD      PDMP not reviewed this encounter.   Chase Picket, MD 09/20/21 [phone]

## 2021-10-01 ENCOUNTER — Ambulatory Visit: Payer: No Typology Code available for payment source | Admitting: Cardiovascular Disease

## 2021-10-02 ENCOUNTER — Ambulatory Visit: Payer: No Typology Code available for payment source | Admitting: Cardiovascular Disease

## 2021-10-08 ENCOUNTER — Encounter: Payer: Self-pay | Admitting: *Deleted

## 2021-10-16 ENCOUNTER — Encounter (HOSPITAL_COMMUNITY): Payer: Self-pay

## 2021-10-26 ENCOUNTER — Ambulatory Visit: Payer: No Typology Code available for payment source | Admitting: Cardiovascular Disease

## 2021-12-12 ENCOUNTER — Encounter: Payer: Self-pay | Admitting: Family Medicine

## 2021-12-27 ENCOUNTER — Encounter: Payer: Self-pay | Admitting: *Deleted

## 2022-04-14 ENCOUNTER — Encounter (HOSPITAL_COMMUNITY): Payer: Self-pay

## 2022-04-14 ENCOUNTER — Emergency Department (HOSPITAL_COMMUNITY)
Admission: EM | Admit: 2022-04-14 | Discharge: 2022-04-14 | Disposition: A | Discharge status: Home / Self Care | Payer: No Typology Code available for payment source | Attending: Emergency Medicine | Admitting: Emergency Medicine

## 2022-04-14 ENCOUNTER — Other Ambulatory Visit: Payer: Self-pay

## 2022-04-14 ENCOUNTER — Emergency Department (HOSPITAL_COMMUNITY): Payer: No Typology Code available for payment source

## 2022-04-14 DIAGNOSIS — J449 Chronic obstructive pulmonary disease, unspecified: Secondary | ICD-10-CM | POA: Insufficient documentation

## 2022-04-14 DIAGNOSIS — Z91040 Latex allergy status: Secondary | ICD-10-CM | POA: Diagnosis not present

## 2022-04-14 DIAGNOSIS — N3000 Acute cystitis without hematuria: Secondary | ICD-10-CM | POA: Insufficient documentation

## 2022-04-14 DIAGNOSIS — K8020 Calculus of gallbladder without cholecystitis without obstruction: Secondary | ICD-10-CM | POA: Insufficient documentation

## 2022-04-14 DIAGNOSIS — R1011 Right upper quadrant pain: Secondary | ICD-10-CM | POA: Diagnosis present

## 2022-04-14 LAB — CBC WITH DIFFERENTIAL/PLATELET
Abs Immature Granulocytes: 0.08 10*3/uL — ABNORMAL HIGH (ref 0.00–0.07)
Basophils Absolute: 0 10*3/uL (ref 0.0–0.1)
Basophils Relative: 0 %
Eosinophils Absolute: 0.3 10*3/uL (ref 0.0–0.5)
Eosinophils Relative: 2 %
HCT: 42.1 % (ref 36.0–46.0)
Hemoglobin: 14.1 g/dL (ref 12.0–15.0)
Immature Granulocytes: 1 %
Lymphocytes Relative: 27 %
Lymphs Abs: 3.2 10*3/uL (ref 0.7–4.0)
MCH: 31.4 pg (ref 26.0–34.0)
MCHC: 33.5 g/dL (ref 30.0–36.0)
MCV: 93.8 fL (ref 80.0–100.0)
Monocytes Absolute: 0.9 10*3/uL (ref 0.1–1.0)
Monocytes Relative: 8 %
Neutro Abs: 7.3 10*3/uL (ref 1.7–7.7)
Neutrophils Relative %: 62 %
Platelets: 247 10*3/uL (ref 150–400)
RBC: 4.49 MIL/uL (ref 3.87–5.11)
RDW: 13.4 % (ref 11.5–15.5)
WBC: 11.7 10*3/uL — ABNORMAL HIGH (ref 4.0–10.5)
nRBC: 0 % (ref 0.0–0.2)

## 2022-04-14 LAB — URINALYSIS, ROUTINE W REFLEX MICROSCOPIC
Bilirubin Urine: NEGATIVE
Glucose, UA: NEGATIVE mg/dL
Ketones, ur: NEGATIVE mg/dL
Nitrite: NEGATIVE
Protein, ur: NEGATIVE mg/dL
Specific Gravity, Urine: 1.016 (ref 1.005–1.030)
WBC, UA: >50 WBC/hpf (ref 0–5)
pH: 6 (ref 5.0–8.0)

## 2022-04-14 LAB — COMPREHENSIVE METABOLIC PANEL
ALT: 19 U/L (ref 0–44)
AST: 20 U/L (ref 15–41)
Albumin: 4.3 g/dL (ref 3.5–5.0)
Alkaline Phosphatase: 68 U/L (ref 38–126)
Anion gap: 10 (ref 5–15)
BUN: 16 mg/dL (ref 6–20)
CO2: 25 mmol/L (ref 22–32)
Calcium: 8.8 mg/dL — ABNORMAL LOW (ref 8.9–10.3)
Chloride: 101 mmol/L (ref 98–111)
Creatinine, Ser: 1.13 mg/dL — ABNORMAL HIGH (ref 0.44–1.00)
GFR, Estimated: 57 mL/min — ABNORMAL LOW (ref >60)
Glucose, Bld: 123 mg/dL — ABNORMAL HIGH (ref 70–99)
Potassium: 3.7 mmol/L (ref 3.5–5.1)
Sodium: 136 mmol/L (ref 135–145)
Total Bilirubin: 0.6 mg/dL (ref 0.3–1.2)
Total Protein: 7.3 g/dL (ref 6.5–8.1)

## 2022-04-14 MED ORDER — ONDANSETRON HCL 4 MG/2ML IJ SOLN
4.0000 mg | Freq: Once | INTRAMUSCULAR | Status: AC
  Administered 2022-04-14: 4 mg via INTRAVENOUS
  Filled 2022-04-14: qty 2

## 2022-04-14 MED ORDER — OXYCODONE-ACETAMINOPHEN 5-325 MG PO TABS: 1.0000 | ORAL_TABLET | Freq: Four times a day (QID) | ORAL | 0 refills | Status: DC | PRN

## 2022-04-14 MED ORDER — MORPHINE SULFATE (PF) 4 MG/ML IV SOLN
4.0000 mg | Freq: Once | INTRAVENOUS | Status: AC
  Administered 2022-04-14: 4 mg via INTRAVENOUS
  Filled 2022-04-14: qty 1

## 2022-04-14 MED ORDER — CEPHALEXIN 250 MG PO CAPS: 250.0000 mg | ORAL_CAPSULE | Freq: Four times a day (QID) | ORAL | 0 refills | Status: DC

## 2022-04-14 NOTE — ED Provider Notes (Signed)
[place] EMERGENC[facility] DEPARTMENT AT [facility] Provider Note   CSN: [id number] Arrival date & time: 04/14/22  1706     History {Add pertinent medical, surgical, social history, OB history to HPI:1} Chief Complaint  Patient presents with   Abdominal Pain    [name] is a 57 y.o. 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 Tarri Glenn, repeat in 7 years   VIN II (vulvar intraepithelial neoplasia II)    Past Surgical History:  Procedure Laterality Date   ABDOMINAL HYSTERECTOMY     CARPAL TUNNEL RELEASE Bilateral    11/2019   CERVICAL CONE BIOPSY  1996   Laser   COLONOSCOPY     LASER ABLATION OF THE CERVIX     MYRINGOTOMY     Bilateral, age 89 and 68   TONSILLECTOMY AND ADENOIDECTOMY     TOTAL VAGINAL HYSTERECTOMY  07/2007   TVH-menorrhagia, dysmenorrhea, still have fallopians tubes and ovaries   TUBAL LIGATION     Tubal Ligation/Steriization   VULVECTOMY N/A 09/25/2017   Procedure: PARTIAL VULVECTOMY;  Surgeon: Isabel Caprice, MD;  Location: [place];  Service: Gynecology;  Laterality: N/A;   WISDOM TOOTH EXTRACTION       Home Medications Prior to Admission medications   Medication Sig Start Date End Date Taking? Authorizing Provider  acetaminophen (TYLENOL) 325 MG tablet Take 650 mg by mouth every 6 (six) hours as needed.    [provider]  albuterol (VENTOLIN HFA) 108 (90 Base) MCG/ACT inhaler Inhale 1 puff into the lungs every 6 (six) hours as needed for wheezing or shortness of breath. 09/20/21   Lamptey, Myrene Galas, MD  hydrochlorothiazide (HYDRODIURIL) 25 MG tablet Take 1 tablet (25 mg total) by mouth daily. 08/22/21   Nahser, Wonda Cheng, MD  metoprolol tartrate (LOPRESSOR) 100 MG tablet Take 1 tablet (100 mg total) by mouth once for 1 dose. Take 1 tablet (100 mg total) two hours prior to CT scan. 08/22/21 08/22/21  Nahser, Wonda Cheng, MD  omeprazole (PRILOSEC) 40 MG capsule Take 1 capsule (40 mg total) by mouth 2 (two) times daily. 05/16/21   Zehr, Janett Billow D, PA-C  ondansetron (ZOFRAN-ODT) 4 MG disintegrating tablet Take 1 mg by mouth every 8 (eight) hours as needed for nausea or vomiting.    [provider]  potassium chloride (KLOR-CON M) 10 MEQ tablet Take 1 tablet (10 mEq total) by mouth 2 (two) times daily. 08/22/21   Nahser, Wonda Cheng, MD  valsartan (DIOVAN) 80 MG tablet Take 1 tablet (80 mg total) by mouth daily. 08/22/21   Nahser, Wonda Cheng, MD      Allergies    Metronidazole and Latex    Review of Systems   Review of  Systems  Gastrointestinal:  Positive for abdominal pain.    Physical Exam Updated Vital Signs BP 129/72   Pulse 93   Temp 98.8 F (37.1 C)   Resp 15   Ht 5\' 4"  (1.626 m)   Wt 95.3 kg   SpO2 95%   BMI 36.05 kg/m  Physical Exam  ED Results / Procedures / Treatments   Labs (all labs ordered are listed, but only abnormal results are displayed) Labs Reviewed - No data to display  EKG None  Radiology No results found.  Procedures Procedures  {Document cardiac monitor, telemetry assessment procedure when appropriate:1}  Medications Ordered in ED Medications - No data to display  ED Course/ Medical Decision Making/ A&P   {   Click here for ABCD2, HEART and other calculatorsREFRESH Note before signing :1}                          Medical Decision Making Amount and/or  Complexity of Data Reviewed Labs: ordered. Radiology: ordered.  Risk Prescription drug management.   ***  {Document critical care time when appropriate:1} {Document review of labs and clinical decision tools ie heart score, Chads2Vasc2 etc:1}  {Document your independent review of radiology images, and any outside records:1} {Document your discussion with family members, caretakers, and with consultants:1} {Document social determinants of health affecting pt's care:1} {Document your decision making why or why not admission, treatments were needed:1} Final Clinical Impression(s) / ED Diagnoses Final diagnoses:  None    Rx / DC Orders ED Discharge Orders     None

## 2022-04-14 NOTE — ED Triage Notes (Signed)
Pt states she is having RUQ pain radiating to her sternum and lower abd since Wednesday . Pt states every time she eats, she gets nauseated, chills, etc.

## 2022-04-14 NOTE — ED Notes (Signed)
Discharge instructions reviewed and pt advised to use percocet with caution due to possible SE. Pt states that she is going to follow up with urology before taking antibiotic prescribed today. Pt expressed frustration as her pain remains the same. Pt encouraged to follow up outpatient and take meds as prescribed. Pt ambulatory with steady gait upon discharge.

## 2022-04-14 NOTE — ED Notes (Signed)
Lab called to check on status of lab work as it is charted as collected at [facility] and is not currently in process or resulted. Lab states that they will run labs at this time. Provider aware.

## 2022-04-14 NOTE — Discharge Instructions (Addendum)
Your ultrasound today showed gallstones without any sign of infection.  Please take your medications as prescribed. Take tylenol/ibuprofen or Percocet as needed for pain. I recommend close follow-up with gastroenterology for reevaluation.  Please do not hesitate to return to emergency department if worrisome signs symptoms we discussed become apparent.

## 2022-04-16 NOTE — Telephone Encounter (Signed)
Urgent referral, records, pt's demographic and insurance information have been faxed to [facility] at [phone]. Patient notified via [place].

## 2022-04-23 ENCOUNTER — Other Ambulatory Visit: Payer: Self-pay | Admitting: General Surgery

## 2022-04-23 DIAGNOSIS — K8020 Calculus of gallbladder without cholecystitis without obstruction: Secondary | ICD-10-CM

## 2022-04-24 ENCOUNTER — Encounter (HOSPITAL_COMMUNITY): Payer: Self-pay | Admitting: General Surgery

## 2022-04-24 ENCOUNTER — Other Ambulatory Visit: Payer: Self-pay

## 2022-04-24 NOTE — Progress Notes (Signed)
PCP - Asencion Partridge, MD Cardiologist - Kristeen Miss, MD  PPM/ICD - denies  EKG - 04/14/22 ECHO - 09/07/21  CPAP - n/a  Fasting Blood Sugar - n/a  Blood Thinner Instructions: n/a Patient was instructed: As of today, STOP taking any Aspirin (unless otherwise instructed by your surgeon) Aleve, Naproxen, Ibuprofen, Motrin, Advil, Goody's, BC's, all herbal medications, fish oil, and all vitamins.  ERAS Protcol - yes, until 06:30 o'clock  COVID TEST- n/a  Anesthesia review: no  Patient verbally denies any shortness of breath, fever, cough and chest pain during phone call   -------------  SDW INSTRUCTIONS given:  Your procedure is scheduled on Friday, April 12th, 2024.   Report to [facility] Main Entrance "A" at 07:00 A.M., and check in at the Admitting office.  Call this number if you have problems the morning of surgery:  [phone]   Remember:  Do not eat after midnight the night before your surgery  You may drink clear liquids until 06:30 the morning of your surgery.   Clear liquids allowed are: Water, Non-Citrus Juices (without pulp), Carbonated Beverages, Clear Tea, Black Coffee Only, and Gatorade    Take these medicines the morning of surgery with A SIP OF WATER: Prilosec PRN: Tylenol, Meclizine, inhaler  Please bring the inhaler with you the day of surgery.     The day of surgery:                     Do not wear jewelry, make up, or nail polish            Do not wear lotions, powders, perfumes, or deodorant.            Do not shave 48 hours prior to surgery.              Do not bring valuables to the hospital.            [facility] is not responsible for any belongings or valuables.  Do NOT Smoke (Tobacco/Vaping) 24 hours prior to your procedure If you use a CPAP at night, you may bring all equipment for your overnight stay.   Contacts, glasses, dentures or bridgework may not be worn into surgery.      For patients admitted to the hospital, discharge  time will be determined by your treatment team.   Patients discharged the day of surgery will not be allowed to drive home, and someone needs to stay with them for 24 hours.    Special instructions:   [place]- Preparing For Surgery  Before surgery, you can play an important role. Because skin is not sterile, your skin needs to be as free of germs as possible. You can reduce the number of germs on your skin by washing with CHG (chlorahexidine gluconate) Soap before surgery.  CHG is an antiseptic cleaner which kills germs and bonds with the skin to continue killing germs even after washing.    Oral Hygiene is also important to reduce your risk of infection.  Remember - BRUSH YOUR TEETH THE MORNING OF SURGERY WITH YOUR REGULAR TOOTHPASTE  Please do not use if you have an allergy to CHG or antibacterial soaps. If your skin becomes reddened/irritated stop using the CHG.  Do not shave (including legs and underarms) for at least 48 hours prior to first CHG shower. It is OK to shave your face.  Please follow these instructions carefully.   Shower the NIGHT BEFORE SURGERY and the MORNING OF  SURGERY with DIAL Soap.   Pat yourself dry with a CLEAN TOWEL.  Wear CLEAN PAJAMAS to bed the night before surgery  Place CLEAN SHEETS on your bed the night of your first shower and DO NOT SLEEP WITH PETS.   Day of Surgery: Please shower morning of surgery  Wear Clean/Comfortable clothing the morning of surgery Do not apply any deodorants/lotions.   Remember to brush your teeth WITH YOUR REGULAR TOOTHPASTE.   Questions were answered. Patient verbalized understanding of instructions.

## 2022-04-25 NOTE — H&P (Signed)
57 year old female who has a history of hypertension. She has had 4 years of abdominal pain. She describes this pain as right upper quadrant and epigastric pain after eating. She reports that she has had a negative colonoscopy and a negative EGD over that time. This has been getting more frequent and more severe over that time. This resulted in at the end of last month and the ER visit. At that time she was noted to have normal labs. She underwent a right upper quadrant ultrasound which shows a 2.1 cm gallstone within the gallbladder. She did not appear to have acute cholecystitis at that time and was sent home. She was referred to see me. Since then she is not eating much because she still has discomfort. She does have a history of constipation which is unchanged. She has nausea associated with this as well.  Review of Systems: A complete review of systems was obtained from the patient. I have reviewed this information and discussed as appropriate with the patient. See HPI as well for other ROS.  Review of Systems  Constitutional: Positive for chills.  HENT: Positive for tinnitus.  Gastrointestinal: Positive for abdominal pain, constipation, nausea and vomiting.  Musculoskeletal: Positive for neck pain.  Neurological: Positive for dizziness and headaches.  Psychiatric/Behavioral: Positive for memory loss.   Medical History: Past Medical History:  Diagnosis Date  Arthritis  COPD (chronic obstructive pulmonary disease) (CMS/HHS-HCC)  GERD (gastroesophageal reflux disease)  Hypertension   Past Surgical History:  Procedure Laterality Date  HYSTERECTOMY 2009   Allergies  Allergen Reactions  Ketorolac Other (See Comments)  headaches  Metronidazole Itching  Latex Rash   Current Outpatient Medications on File Prior to Visit  Medication Sig Dispense Refill  albuterol MDI, PROVENTIL, VENTOLIN, PROAIR, HFA 90 mcg/actuation inhaler Inhale into the lungs every 6 (six) hours as needed   hydroCHLOROthiazide (HYDRODIURIL) 25 MG tablet Take 1 tablet by mouth once daily  metoprolol tartrate (LOPRESSOR) 100 MG tablet Take by mouth  omeprazole (PRILOSEC) 40 MG DR capsule Take 40 mg by mouth 2 (two) times daily  potassium chloride (KLOR-CON M10) 10 mEq ER tablet Take 10 mEq by mouth 2 (two) times daily  valsartan (DIOVAN) 80 MG tablet Take by mouth   History reviewed. No pertinent family history.   Social History   Tobacco Use  Smoking Status Every Day  Types: Cigarettes  Smokeless Tobacco Never  Marital status: Single  Tobacco Use  Smoking status: Every Day  Types: Cigarettes  Smokeless tobacco: Never  Substance and Sexual Activity  Alcohol use: Yes  Drug use: Never   Objective:   Vitals:  04/23/22 1449  BP: 123/77  Pulse: 90  Temp: 36.1 C (97 F)  SpO2: 97%  Weight: 95.1 kg (209 lb 9.6 oz)  Height: 162.6 cm (5\' 4" )  PainSc: 4   Body mass index is 35.98 kg/m.  Physical Exam Vitals reviewed.  Constitutional:  Appearance: Normal appearance. She is obese.  Eyes:  General: No scleral icterus. Abdominal:  General: There is no distension.  Palpations: Abdomen is soft.  Tenderness: There is abdominal tenderness in the right upper quadrant and epigastric area.  Neurological:  Mental Status: She is alert.    Assessment and Plan:   Gallstones  Laparoscopic cholecystectomy  I do think that at least a large portion of her symptoms are coming from her gallbladder. She has a gallstone on the recent ultrasound that she is tender on her examination. I think it is reasonable to take her to  the operating room and do a laparoscopic cholecystectomy. I think it is also reasonable given her current symptoms to do this is soon as possible.  I discussed the procedure in detail. We discussed the risks and benefits of a laparoscopic cholecystectomy and possible cholangiogram including, but not limited to bleeding, infection, injury to surrounding structures such as  the intestine or liver, bile leak, retained gallstones, need to convert to an open procedure, prolonged diarrhea, blood clots such as DVT, common bile duct injury, anesthesia risks, and possible need for additional procedures. The likelihood of improvement in symptoms and return to the patient's normal status is good. We discussed the typical post-operative recovery course.

## 2022-04-26 ENCOUNTER — Encounter (HOSPITAL_COMMUNITY): Payer: Self-pay | Admitting: General Surgery

## 2022-04-26 ENCOUNTER — Ambulatory Visit (HOSPITAL_COMMUNITY)
Admission: RE | Admit: 2022-04-26 | Discharge: 2022-04-26 | Disposition: A | Discharge status: Home / Self Care | Payer: No Typology Code available for payment source | Attending: General Surgery | Admitting: General Surgery

## 2022-04-26 ENCOUNTER — Ambulatory Visit (HOSPITAL_BASED_OUTPATIENT_CLINIC_OR_DEPARTMENT_OTHER): Payer: No Typology Code available for payment source | Admitting: Anesthesiology

## 2022-04-26 ENCOUNTER — Other Ambulatory Visit: Payer: Self-pay

## 2022-04-26 ENCOUNTER — Ambulatory Visit (HOSPITAL_COMMUNITY): Payer: No Typology Code available for payment source | Admitting: Anesthesiology

## 2022-04-26 ENCOUNTER — Encounter (HOSPITAL_COMMUNITY)
Admission: RE | Disposition: A | Discharge status: Home / Self Care | Payer: Self-pay | Source: Home / Self Care | Attending: General Surgery

## 2022-04-26 DIAGNOSIS — F17210 Nicotine dependence, cigarettes, uncomplicated: Secondary | ICD-10-CM | POA: Insufficient documentation

## 2022-04-26 DIAGNOSIS — K219 Gastro-esophageal reflux disease without esophagitis: Secondary | ICD-10-CM | POA: Insufficient documentation

## 2022-04-26 DIAGNOSIS — N289 Disorder of kidney and ureter, unspecified: Secondary | ICD-10-CM | POA: Insufficient documentation

## 2022-04-26 DIAGNOSIS — Z6835 Body mass index (BMI) 35.0-35.9, adult: Secondary | ICD-10-CM | POA: Insufficient documentation

## 2022-04-26 DIAGNOSIS — M1990 Unspecified osteoarthritis, unspecified site: Secondary | ICD-10-CM | POA: Insufficient documentation

## 2022-04-26 DIAGNOSIS — J449 Chronic obstructive pulmonary disease, unspecified: Secondary | ICD-10-CM | POA: Insufficient documentation

## 2022-04-26 DIAGNOSIS — I10 Essential (primary) hypertension: Secondary | ICD-10-CM | POA: Diagnosis not present

## 2022-04-26 DIAGNOSIS — K8020 Calculus of gallbladder without cholecystitis without obstruction: Secondary | ICD-10-CM | POA: Diagnosis present

## 2022-04-26 DIAGNOSIS — E669 Obesity, unspecified: Secondary | ICD-10-CM | POA: Insufficient documentation

## 2022-04-26 DIAGNOSIS — G709 Myoneural disorder, unspecified: Secondary | ICD-10-CM | POA: Insufficient documentation

## 2022-04-26 DIAGNOSIS — K812 Acute cholecystitis with chronic cholecystitis: Secondary | ICD-10-CM

## 2022-04-26 DIAGNOSIS — K8010 Calculus of gallbladder with chronic cholecystitis without obstruction: Secondary | ICD-10-CM | POA: Diagnosis not present

## 2022-04-26 DIAGNOSIS — Z79899 Other long term (current) drug therapy: Secondary | ICD-10-CM | POA: Insufficient documentation

## 2022-04-26 HISTORY — DX: Personal history of urinary calculi: Z87.442

## 2022-04-26 HISTORY — DX: Other specified postprocedural states: Z98.890

## 2022-04-26 HISTORY — PX: CHOLECYSTECTOMY: SHX55

## 2022-04-26 HISTORY — DX: Essential (primary) hypertension: I10

## 2022-04-26 HISTORY — DX: Nausea with vomiting, unspecified: R11.2

## 2022-04-26 HISTORY — DX: Gestational diabetes mellitus in pregnancy, unspecified control: O24.419

## 2022-04-26 LAB — COMPREHENSIVE METABOLIC PANEL
ALT: 18 U/L (ref 0–44)
AST: 17 U/L (ref 15–41)
Albumin: 3.6 g/dL (ref 3.5–5.0)
Alkaline Phosphatase: 70 U/L (ref 38–126)
Anion gap: 9 (ref 5–15)
BUN: 11 mg/dL (ref 6–20)
CO2: 25 mmol/L (ref 22–32)
Calcium: 8.9 mg/dL (ref 8.9–10.3)
Chloride: 103 mmol/L (ref 98–111)
Creatinine, Ser: 0.86 mg/dL (ref 0.44–1.00)
GFR, Estimated: >60 mL/min (ref >60)
Glucose, Bld: 102 mg/dL — ABNORMAL HIGH (ref 70–99)
Potassium: 3.6 mmol/L (ref 3.5–5.1)
Sodium: 137 mmol/L (ref 135–145)
Total Bilirubin: 0.6 mg/dL (ref 0.3–1.2)
Total Protein: 6.4 g/dL — ABNORMAL LOW (ref 6.5–8.1)

## 2022-04-26 SURGERY — LAPAROSCOPIC CHOLECYSTECTOMY WITH INTRAOPERATIVE CHOLANGIOGRAM
Anesthesia: General

## 2022-04-26 MED ORDER — PHENYLEPHRINE 80 MCG/ML (10ML) SYRINGE FOR IV PUSH (FOR BLOOD PRESSURE SUPPORT)
PREFILLED_SYRINGE | INTRAVENOUS | Status: AC
  Filled 2022-04-26: qty 10

## 2022-04-26 MED ORDER — FENTANYL CITRATE (PF) 100 MCG/2ML IJ SOLN
INTRAMUSCULAR | Status: AC
  Administered 2022-04-26: 100 ug via INTRAVENOUS
  Filled 2022-04-26: qty 2

## 2022-04-26 MED ORDER — OXYCODONE HCL 5 MG PO TABS
ORAL_TABLET | ORAL | Status: AC
  Filled 2022-04-26: qty 1

## 2022-04-26 MED ORDER — ACETAMINOPHEN 500 MG PO TABS
ORAL_TABLET | ORAL | Status: AC
  Administered 2022-04-26: 1000 mg via ORAL
  Filled 2022-04-26: qty 2

## 2022-04-26 MED ORDER — HYDROMORPHONE HCL 1 MG/ML IJ SOLN
INTRAMUSCULAR | Status: DC | PRN
  Administered 2022-04-26 (×2): .25 mg via INTRAVENOUS

## 2022-04-26 MED ORDER — DEXAMETHASONE SODIUM PHOSPHATE 10 MG/ML IJ SOLN
INTRAMUSCULAR | Status: DC | PRN
  Administered 2022-04-26: 10 mg via INTRAVENOUS

## 2022-04-26 MED ORDER — MIDAZOLAM HCL 2 MG/2ML IJ SOLN
INTRAMUSCULAR | Status: DC | PRN
  Administered 2022-04-26: 2 mg via INTRAVENOUS

## 2022-04-26 MED ORDER — MIDAZOLAM HCL 2 MG/2ML IJ SOLN
INTRAMUSCULAR | Status: AC
  Administered 2022-04-26: 2 mg via INTRAVENOUS
  Filled 2022-04-26: qty 2

## 2022-04-26 MED ORDER — FENTANYL CITRATE (PF) 250 MCG/5ML IJ SOLN
INTRAMUSCULAR | Status: AC
  Filled 2022-04-26: qty 5

## 2022-04-26 MED ORDER — HYDROMORPHONE HCL 1 MG/ML IJ SOLN
INTRAMUSCULAR | Status: AC
  Filled 2022-04-26: qty 1

## 2022-04-26 MED ORDER — BUPIVACAINE LIPOSOME 1.3 % IJ SUSP
INTRAMUSCULAR | Status: DC | PRN
  Administered 2022-04-26 (×2): 5 mL via PERINEURAL

## 2022-04-26 MED ORDER — SPY AGENT GREEN - (INDOCYANINE FOR INJECTION)
1.2500 mg | Freq: Once | INTRAMUSCULAR | Status: AC
  Administered 2022-04-26: 1.25 mg via INTRAVENOUS
  Filled 2022-04-26: qty 10

## 2022-04-26 MED ORDER — SCOPOLAMINE 1 MG/3DAYS TD PT72: 1.0000 | MEDICATED_PATCH | Freq: Once | TRANSDERMAL | Status: DC

## 2022-04-26 MED ORDER — OXYCODONE HCL 5 MG PO TABS: 5.0000 mg | ORAL_TABLET | Freq: Four times a day (QID) | ORAL | 0 refills | Status: DC | PRN

## 2022-04-26 MED ORDER — LACTATED RINGERS IV SOLN: INTRAVENOUS | Status: DC

## 2022-04-26 MED ORDER — FENTANYL CITRATE (PF) 100 MCG/2ML IJ SOLN: 100.0000 ug | Freq: Once | INTRAMUSCULAR | Status: AC

## 2022-04-26 MED ORDER — DEXMEDETOMIDINE HCL IN NACL 80 MCG/20ML IV SOLN
INTRAVENOUS | Status: DC | PRN
  Administered 2022-04-26: 12 ug via BUCCAL

## 2022-04-26 MED ORDER — MIDAZOLAM HCL 2 MG/2ML IJ SOLN
INTRAMUSCULAR | Status: AC
  Filled 2022-04-26: qty 2

## 2022-04-26 MED ORDER — ONDANSETRON HCL 4 MG/2ML IJ SOLN: 4.0000 mg | Freq: Once | INTRAMUSCULAR | Status: DC | PRN

## 2022-04-26 MED ORDER — CHLORHEXIDINE GLUCONATE 0.12 % MT SOLN
OROMUCOSAL | Status: AC
  Administered 2022-04-26: 15 mL via OROMUCOSAL
  Filled 2022-04-26: qty 15

## 2022-04-26 MED ORDER — ROCURONIUM BROMIDE 10 MG/ML (PF) SYRINGE
PREFILLED_SYRINGE | INTRAVENOUS | Status: DC | PRN
  Administered 2022-04-26: 70 mg via INTRAVENOUS

## 2022-04-26 MED ORDER — DEXAMETHASONE SODIUM PHOSPHATE 10 MG/ML IJ SOLN
INTRAMUSCULAR | Status: AC
  Filled 2022-04-26: qty 1

## 2022-04-26 MED ORDER — SUGAMMADEX SODIUM 200 MG/2ML IV SOLN
INTRAVENOUS | Status: DC | PRN
  Administered 2022-04-26: 189.6 mg via INTRAVENOUS

## 2022-04-26 MED ORDER — SODIUM CHLORIDE 0.9 % IR SOLN
Status: DC | PRN
  Administered 2022-04-26: 1000 mL

## 2022-04-26 MED ORDER — DROPERIDOL 2.5 MG/ML IJ SOLN
INTRAMUSCULAR | Status: AC
  Filled 2022-04-26: qty 2

## 2022-04-26 MED ORDER — ONDANSETRON HCL 4 MG/2ML IJ SOLN
INTRAMUSCULAR | Status: AC
  Filled 2022-04-26: qty 2

## 2022-04-26 MED ORDER — HYDROMORPHONE HCL 1 MG/ML IJ SOLN
0.2500 mg | INTRAMUSCULAR | Status: DC | PRN
  Administered 2022-04-26 (×4): 0.5 mg via INTRAVENOUS

## 2022-04-26 MED ORDER — BUPIVACAINE LIPOSOME 1.3 % IJ SUSP
INTRAMUSCULAR | Status: AC
  Filled 2022-04-26: qty 10

## 2022-04-26 MED ORDER — DROPERIDOL 2.5 MG/ML IJ SOLN
0.6250 mg | Freq: Once | INTRAMUSCULAR | Status: AC | PRN
  Administered 2022-04-26: 0.625 mg via INTRAVENOUS

## 2022-04-26 MED ORDER — MIDAZOLAM HCL 2 MG/2ML IJ SOLN: 2.0000 mg | Freq: Once | INTRAMUSCULAR | Status: AC

## 2022-04-26 MED ORDER — ACETAMINOPHEN 500 MG PO TABS: 1000.0000 mg | ORAL_TABLET | ORAL | Status: AC

## 2022-04-26 MED ORDER — CHLORHEXIDINE GLUCONATE 0.12 % MT SOLN: 15.0000 mL | Freq: Once | OROMUCOSAL | Status: AC

## 2022-04-26 MED ORDER — PROPOFOL 10 MG/ML IV BOLUS
INTRAVENOUS | Status: AC
  Filled 2022-04-26: qty 20

## 2022-04-26 MED ORDER — SCOPOLAMINE 1 MG/3DAYS TD PT72
MEDICATED_PATCH | TRANSDERMAL | Status: AC
  Administered 2022-04-26: 1.5 mg via TRANSDERMAL
  Filled 2022-04-26: qty 1

## 2022-04-26 MED ORDER — OXYCODONE HCL 5 MG PO TABS: 5.0000 mg | ORAL_TABLET | ORAL | Status: DC | PRN

## 2022-04-26 MED ORDER — PHENYLEPHRINE 80 MCG/ML (10ML) SYRINGE FOR IV PUSH (FOR BLOOD PRESSURE SUPPORT)
PREFILLED_SYRINGE | INTRAVENOUS | Status: DC | PRN
  Administered 2022-04-26: 40 ug via INTRAVENOUS

## 2022-04-26 MED ORDER — ENSURE PRE-SURGERY PO LIQD: 296.0000 mL | Freq: Once | ORAL | Status: DC

## 2022-04-26 MED ORDER — FENTANYL CITRATE (PF) 250 MCG/5ML IJ SOLN
INTRAMUSCULAR | Status: DC | PRN
  Administered 2022-04-26: 200 ug via INTRAVENOUS
  Administered 2022-04-26: 50 ug via INTRAVENOUS

## 2022-04-26 MED ORDER — PROPOFOL 10 MG/ML IV BOLUS
INTRAVENOUS | Status: DC | PRN
  Administered 2022-04-26: 200 mg via INTRAVENOUS

## 2022-04-26 MED ORDER — OXYCODONE HCL 5 MG PO TABS
5.0000 mg | ORAL_TABLET | Freq: Once | ORAL | Status: AC | PRN
  Administered 2022-04-26: 5 mg via ORAL

## 2022-04-26 MED ORDER — 0.9 % SODIUM CHLORIDE (POUR BTL) OPTIME
TOPICAL | Status: DC | PRN
  Administered 2022-04-26: 1000 mL

## 2022-04-26 MED ORDER — CEFAZOLIN SODIUM-DEXTROSE 2-4 GM/100ML-% IV SOLN
INTRAVENOUS | Status: AC
  Filled 2022-04-26: qty 100

## 2022-04-26 MED ORDER — ACETAMINOPHEN 325 MG PO TABS: 650.0000 mg | ORAL_TABLET | ORAL | Status: DC | PRN

## 2022-04-26 MED ORDER — OXYCODONE HCL 5 MG/5ML PO SOLN: 5.0000 mg | Freq: Once | ORAL | Status: AC | PRN

## 2022-04-26 MED ORDER — ORAL CARE MOUTH RINSE: 15.0000 mL | Freq: Once | OROMUCOSAL | Status: AC

## 2022-04-26 MED ORDER — SODIUM CHLORIDE 0.9 % IV SOLN: 250.0000 mL | INTRAVENOUS | Status: DC | PRN

## 2022-04-26 MED ORDER — BUPIVACAINE-EPINEPHRINE (PF) 0.5% -1:200000 IJ SOLN
INTRAMUSCULAR | Status: AC
  Filled 2022-04-26: qty 30

## 2022-04-26 MED ORDER — CEFAZOLIN SODIUM-DEXTROSE 2-4 GM/100ML-% IV SOLN
2.0000 g | INTRAVENOUS | Status: AC
  Administered 2022-04-26: 2 g via INTRAVENOUS

## 2022-04-26 MED ORDER — CHLORHEXIDINE GLUCONATE CLOTH 2 % EX PADS: 6.0000 | MEDICATED_PAD | Freq: Once | CUTANEOUS | Status: DC

## 2022-04-26 MED ORDER — HYDROMORPHONE HCL 1 MG/ML IJ SOLN
INTRAMUSCULAR | Status: AC
  Filled 2022-04-26: qty 0.5

## 2022-04-26 MED ORDER — ACETAMINOPHEN 650 MG RE SUPP: 650.0000 mg | RECTAL | Status: DC | PRN

## 2022-04-26 MED ORDER — ROCURONIUM BROMIDE 10 MG/ML (PF) SYRINGE
PREFILLED_SYRINGE | INTRAVENOUS | Status: AC
  Filled 2022-04-26: qty 10

## 2022-04-26 MED ORDER — SODIUM CHLORIDE 0.9% FLUSH: 3.0000 mL | INTRAVENOUS | Status: DC | PRN

## 2022-04-26 MED ORDER — ONDANSETRON HCL 4 MG/2ML IJ SOLN
INTRAMUSCULAR | Status: DC | PRN
  Administered 2022-04-26: 4 mg via INTRAVENOUS

## 2022-04-26 MED ORDER — BUPIVACAINE HCL (PF) 0.25 % IJ SOLN
INTRAMUSCULAR | Status: DC | PRN
  Administered 2022-04-26 (×2): 20 mL via PERINEURAL

## 2022-04-26 MED ORDER — LIDOCAINE 2% (20 MG/ML) 5 ML SYRINGE
INTRAMUSCULAR | Status: DC | PRN
  Administered 2022-04-26: 80 mg via INTRAVENOUS

## 2022-04-26 MED ORDER — BUPIVACAINE-EPINEPHRINE 0.5% -1:200000 IJ SOLN
INTRAMUSCULAR | Status: DC | PRN
  Administered 2022-04-26: 10 mL

## 2022-04-26 MED ORDER — LIDOCAINE 2% (20 MG/ML) 5 ML SYRINGE
INTRAMUSCULAR | Status: AC
  Filled 2022-04-26: qty 5

## 2022-04-26 SURGICAL SUPPLY — 47 items
ADH SKN CLS APL DERMABOND .7 (GAUZE/BANDAGES/DRESSINGS) ×1
APL PRP STRL LF DISP 70% ISPRP (MISCELLANEOUS) ×1
APPLIER CLIP 5 13 M/L LIGAMAX5 (MISCELLANEOUS) ×1
APR CLP MED LRG 5 ANG JAW (MISCELLANEOUS) ×1
BAG COUNTER SPONGE SURGICOUNT (BAG) ×1 IMPLANT
BAG SPEC RTRVL 10 TROC 200 (ENDOMECHANICALS) ×1
BAG SPNG CNTER NS LX DISP (BAG) ×1
BLADE CLIPPER SURG (BLADE) IMPLANT
CANISTER SUCT 3000ML PPV (MISCELLANEOUS) ×1 IMPLANT
CHLORAPREP W/TINT 26 (MISCELLANEOUS) ×1 IMPLANT
CLIP APPLIE 5 13 M/L LIGAMAX5 (MISCELLANEOUS) ×1 IMPLANT
COVER MAYO STAND STRL (DRAPES) IMPLANT
COVER SURGICAL LIGHT HANDLE (MISCELLANEOUS) ×1 IMPLANT
DERMABOND ADVANCED .7 DNX12 (GAUZE/BANDAGES/DRESSINGS) ×1 IMPLANT
DRAPE C-ARM 42X120 X-RAY (DRAPES) IMPLANT
ELECT REM PT RETURN 9FT ADLT (ELECTROSURGICAL) ×1
ELECTRODE REM PT RTRN 9FT ADLT (ELECTROSURGICAL) ×1 IMPLANT
GLOVE BIO SURGEON STRL SZ7 (GLOVE) ×1 IMPLANT
GLOVE BIOGEL PI IND STRL 7.5 (GLOVE) ×1 IMPLANT
GOWN STRL REUS W/ TWL LRG LVL3 (GOWN DISPOSABLE) ×3 IMPLANT
GOWN STRL REUS W/TWL LRG LVL3 (GOWN DISPOSABLE) ×3
GRASPER SUT TROCAR 14GX15 (MISCELLANEOUS) ×1 IMPLANT
HEMOSTAT SNOW SURGICEL 2X4 (HEMOSTASIS) IMPLANT
IRRIG SUCT STRYKERFLOW 2 WTIP (MISCELLANEOUS) ×1
IRRIGATION SUCT STRKRFLW 2 WTP (MISCELLANEOUS) ×1 IMPLANT
KIT BASIN OR (CUSTOM PROCEDURE TRAY) ×1 IMPLANT
KIT IMAGING PINPOINTPAQ (MISCELLANEOUS) IMPLANT
KIT TURNOVER KIT B (KITS) ×1 IMPLANT
NS IRRIG 1000ML POUR BTL (IV SOLUTION) ×1 IMPLANT
PAD ARMBOARD 7.5X6 YLW CONV (MISCELLANEOUS) ×1 IMPLANT
POUCH RETRIEVAL ECOSAC 10 (ENDOMECHANICALS) ×1 IMPLANT
POUCH RETRIEVAL ECOSAC 10MM (ENDOMECHANICALS) ×1
SCISSORS LAP 5X35 DISP (ENDOMECHANICALS) ×1 IMPLANT
SET CHOLANGIOGRAPH 5 50 .035 (SET/KITS/TRAYS/PACK) IMPLANT
SET TUBE SMOKE EVAC HIGH FLOW (TUBING) ×1 IMPLANT
SLEEVE Z-THREAD 5X100MM (TROCAR) ×2 IMPLANT
SPECIMEN JAR SMALL (MISCELLANEOUS) ×1 IMPLANT
STRIP CLOSURE SKIN 1/2X4 (GAUZE/BANDAGES/DRESSINGS) ×1 IMPLANT
SUT MNCRL AB 4-0 PS2 18 (SUTURE) ×1 IMPLANT
SUT VICRYL 0 UR6 27IN ABS (SUTURE) ×1 IMPLANT
TOWEL GREEN STERILE (TOWEL DISPOSABLE) ×1 IMPLANT
TOWEL GREEN STERILE FF (TOWEL DISPOSABLE) ×1 IMPLANT
TRAY LAPAROSCOPIC MC (CUSTOM PROCEDURE TRAY) ×1 IMPLANT
TROCAR BALLN 12MMX100 BLUNT (TROCAR) ×1 IMPLANT
TROCAR Z-THREAD OPTICAL 5X100M (TROCAR) ×1 IMPLANT
WARMER LAPAROSCOPE (MISCELLANEOUS) ×1 IMPLANT
WATER STERILE IRR 1000ML POUR (IV SOLUTION) ×1 IMPLANT

## 2022-04-26 NOTE — Discharge Instructions (Signed)
[facility] LAPAROSCOPIC SURGERY: POST OP INSTRUCTIONS  Always review your discharge instruction sheet given to you by the facility where your surgery was performed. IF YOU HAVE DISABILITY OR FAMILY LEAVE FORMS, YOU MUST BRING THEM TO THE OFFICE FOR PROCESSING.   DO NOT GIVE THEM TO YOUR DOCTOR.  A prescription for pain medication may be given to you upon discharge.  Take your pain medication as prescribed, if needed.  If narcotic pain medicine is not needed, then you may take acetaminophen (Tylenol), naprosyn (Alleve), or ibuprofen (Advil) as needed. Take your usually prescribed medications unless otherwise directed. If you need a refill on your pain medication, please contact your pharmacy.  They will contact our office to request authorization. Prescriptions will not be filled after 5pm or on week-ends. You should follow a light diet the first few days after arrival home, such as soup and crackers, etc.  Be sure to include lots of fluids daily. Most patients will experience some swelling and bruising in the area of the incisions.  Ice packs will help.  Swelling and bruising can take several days to resolve.  It is common to experience some constipation if taking pain medication after surgery.  Increasing fluid intake and taking a stool softener (such as Colace) will usually help or prevent this problem from occurring.  A mild laxative (Milk of Magnesia or Miralax) should be taken according to package instructions if there are no bowel movements after 48 hours. Unless discharge instructions indicate otherwise, you may remove your bandages 48 hours after surgery, and you may shower at that time.  You may have steri-strips (small skin tapes) in place directly over the incision.  These strips should be left on the skin for 7-10 days.  If your surgeon used skin glue on the incision, you may shower in 24 hours.  The glue will flake off over the next  2-3 weeks.  Any sutures or staples will be removed at the office during your follow-up visit. ACTIVITIES:  You may resume regular (light) daily activities beginning the next day--such as daily self-care, walking, climbing stairs--gradually increasing activities as tolerated.  You may have sexual intercourse when it is comfortable.  Refrain from any heavy lifting or straining until approved by your doctor. You may drive when you are no longer taking prescription pain medication, you can comfortably wear a seatbelt, and you can safely maneuver your car and apply brakes. RETURN TO WORK:  __________________________________________________________ You should see your doctor in the office for a follow-up appointment approximately 2-3 weeks after your surgery.  Make sure that you call for this appointment within a day or two after you arrive home to insure a convenient appointment time. OTHER INSTRUCTIONS: __________________________________________________________________________________________________________________________ __________________________________________________________________________________________________________________________ WHEN TO CALL YOUR DOCTOR: Fever over 101.0 Inability to urinate Continued bleeding from incision. Increased pain, redness, or drainage from the incision. Increasing abdominal pain  The clinic staff is available to answer your questions during regular business hours.  Please don't hesitate to call and ask to speak to one of the nurses for clinical concerns.  If you have a medical emergency, go to the nearest emergency room or call 911.  A surgeon from [facility] is always on call at the hospital. [address], [address], [place], [place]  [address] ? [address] [address], [place], [place]   [address] [phone] ? [phone] ? FAX [phone] Web site: [email or web address]  

## 2022-04-26 NOTE — Anesthesia Preprocedure Evaluation (Addendum)
Anesthesia Evaluation  Patient identified by MRN, date of birth, ID band Patient awake    Reviewed: Allergy & Precautions, NPO status , Patient's Chart, lab work & pertinent test results, reviewed documented beta blocker date and time   History of Anesthesia Complications (+) PONV and history of anesthetic complications  Airway Mallampati: II  TM Distance: >3 FB Neck ROM: Full    Dental no notable dental hx. (+) Caps, Dental Advisory Given, Chipped,    Pulmonary COPD, Current Smoker and Patient abstained from smoking.   Pulmonary exam normal breath sounds clear to auscultation       Cardiovascular hypertension, Pt. on medications Normal cardiovascular exam Rhythm:Regular Rate:Normal  EKG and Echo normal   Neuro/Psych  Headaches PSYCHIATRIC DISORDERS Anxiety Depression     Neuromuscular disease    GI/Hepatic ,GERD  Medicated,,(+)     substance abuse  Hx/o drug addiction- in remission Symptomatic cholelithiasis   Endo/Other  diabetes, Gestational  Obesity  Renal/GU Renal InsufficiencyRenal diseaseHx/o renal calculi  negative genitourinary   Musculoskeletal  (+) Arthritis , Osteoarthritis,    Abdominal  (+) + obese  Peds  Hematology   Anesthesia Other Findings   Reproductive/Obstetrics HPV Hx/o VIN                             Anesthesia Physical Anesthesia Plan  ASA: 2  Anesthesia Plan: General   Post-op Pain Management: Dilaudid IV, Precedex, Tylenol PO (pre-op)* and Lidocaine infusion*   Induction: Intravenous and Cricoid pressure planned  PONV Risk Score and Plan: 4 or greater and Treatment may vary due to age or medical condition, Scopolamine patch - Pre-op, Midazolam, Ondansetron and Dexamethasone  Airway Management Planned: Oral ETT  Additional Equipment: None  Intra-op Plan:   Post-operative Plan: Extubation in OR  Informed Consent: I have reviewed the patients  History and Physical, chart, labs and discussed the procedure including the risks, benefits and alternatives for the proposed anesthesia with the patient or authorized representative who has indicated his/her understanding and acceptance.     Dental advisory given  Plan Discussed with: CRNA and Anesthesiologist  Anesthesia Plan Comments:         Anesthesia Quick Evaluation

## 2022-04-26 NOTE — Interval H&P Note (Signed)
History and Physical Interval Note:  04/26/2022 9:07 AM  [name]  has presented today for surgery, with the diagnosis of GALLSTONES.  The various methods of treatment have been discussed with the patient and family. After consideration of risks, benefits and other options for treatment, the patient has consented to  Procedure(s) with comments: LAPAROSCOPIC CHOLECYSTECTOMY WITH ICG DYE (N/A) - GEN AND TAP BLOCK as a surgical intervention.  The patient's history has been reviewed, patient examined, no change in status, stable for surgery.  I have reviewed the patient's chart and labs.  Questions were answered to the patient's satisfaction.     Emelia Loron

## 2022-04-26 NOTE — Anesthesia Postprocedure Evaluation (Signed)
Anesthesia Post Note  Patient: [name]  Procedure(s) Performed: LAPAROSCOPIC CHOLECYSTECTOMY WITH ICG DYE     Patient location during evaluation: PACU Anesthesia Type: General Level of consciousness: awake and alert and oriented Pain management: pain level controlled Vital Signs Assessment: post-procedure vital signs reviewed and stable Respiratory status: spontaneous breathing, nonlabored ventilation and respiratory function stable Cardiovascular status: blood pressure returned to baseline and stable Postop Assessment: no apparent nausea or vomiting Anesthetic complications: no   No notable events documented.  Last Vitals:  Vitals:   04/26/22 1114 04/26/22 1115  BP:  132/85  Pulse: 80 79  Resp: 17 (!) 22  Temp:    SpO2: 100% 97%    Last Pain:  Vitals:   04/26/22 1115  TempSrc:   PainSc: Asleep                 Mayana Irigoyen A.

## 2022-04-26 NOTE — Anesthesia Procedure Notes (Signed)
Anesthesia Regional Block: TAP block   Pre-Anesthetic Checklist: , timeout performed,  Correct Patient, Correct Site, Correct Laterality,  Correct Procedure, Correct Position, site marked,  Risks and benefits discussed,  Surgical consent,  Pre-op evaluation,  At surgeon's request and post-op pain management  Laterality: Left  Prep: chloraprep       Needles:  Injection technique: Single-shot  Needle Type: Echogenic Stimulator Needle     Needle Length: 10cm  Needle Gauge: 21   Needle insertion depth: 9 cm   Additional Needles:   Procedures:,,,, ultrasound used (permanent image in chart),,    Narrative:  Start time: 04/26/2022 8:15 AM End time: 04/26/2022 8:20 AM Injection made incrementally with aspirations every 5 mL.  Performed by: Personally  Anesthesiologist: Mal Amabile, MD  Additional Notes: Timeout performed. Patient sedated. Relevant anatomy ID'd using [place]. Incremental 2-45ml injection of LA with frequent aspiration. Patient tolerated procedure well.

## 2022-04-26 NOTE — Op Note (Signed)
Preoperative diagnosis: Cholelithiasis, symptomatic Postoperative diagnosis: Cholelithiasis, chronic cholecystitis Procedure: Laparoscopic cholecystectomy Surgeon: Dr. Harden Mo Anesthesia: General Estimated blood loss: Minimal Specimens: Gallbladder and contents to pathology Complications: None Drains: None Sponge needle count was correct at completion Disposition to recovery stable condition   Indications: 57 year old female who has a history of hypertension. She has had 4 years of abdominal pain. She describes this pain as right upper quadrant and epigastric pain after eating. She reports that she has had a negative colonoscopy and a negative EGD over that time. This has been getting more frequent and more severe over that time. This resulted in at the end of last month in an ER visit. At that time she was noted to have normal labs. She underwent a right upper quadrant ultrasound which shows a 2.1 cm gallstone within the gallbladder. She did not appear to have acute cholecystitis at that time and was sent home. She was referred to see me. Since then she is not eating much because she still has discomfort. She does have a history of constipation which is unchanged. She has nausea associated with this as well.  We discussed proceeding with lap chole given symptoms related to stones.    Procedure: After informed consent was obtained she was taken to the operating room.  She was given antibiotics.  SCDs were in place.  She was placed under general esthesia without complication.  She was prepped and draped in the standard sterile surgical fashion.  Surgical timeout was then performed.   I have treated Marcaine below the umbilicus.  I made a vertical incision.  I incised the fascia and entered the peritoneum bluntly.  This was done without injury.  I then placed a 0 Vicryl pursestring suture through the fascia and introduced a Hassan trocar.  The abdomen was insufflated to 15 mmHg pressure.  I  then placed 3 additional 5 mm trocars in the epigastrium and right upper quadrant without difficulty.  The gallbladder was noted to have chronic cholecystitis.  I had to remove the omentum and the duodenum from the gallbladder.  The liver was very fatty and did have a small hole in it from retracting. I did use ICG dye and was able to use this to confirm my dissection.  I very clearly saw the common bile duct and the critical view of safety.  I was able to clipped the artery 3 times and divided it.  I then identified the cystic duct and clipped it and divided it as well. The duct was viable and the clips traversed the duct.  I then removed the gallbladder from the liver bed.  I placed this in a retrieval bag and removed from the abdomen.  I obtained hemostasis. I did place some snow in the gallbladder fossa as well as on the liver.  I irrigated and this was clear.  I then removed the [facility] trocar and time I pursestring down.  I placed an additional 0 Vicryl suture through the fascia and closed this.  This completely obliterated the umbilical defect.  The remaining trocars were removed.  These were closed with 4-0 Monocryl and glue.  She tolerated this well was extubated and transferred to recovery stable.

## 2022-04-26 NOTE — Transfer of Care (Signed)
Immediate Anesthesia Transfer of Care Note  Patient: [name]  Procedure(s) Performed: LAPAROSCOPIC CHOLECYSTECTOMY WITH ICG DYE  Patient Location: PACU  Anesthesia Type:General  Level of Consciousness: awake and alert   Airway & Oxygen Therapy: Patient Spontanous Breathing  Post-op Assessment: Report given to RN and Post -op Vital signs reviewed and stable  Post vital signs: Reviewed and stable  Last Vitals:  Vitals Value Taken Time  BP 151/87 04/26/22 1045  Temp    Pulse 83 04/26/22 1046  Resp 24 04/26/22 1046  SpO2 94 % 04/26/22 1046  Vitals shown include unvalidated device data.  Last Pain:  Vitals:   04/26/22 0825  TempSrc:   PainSc: 0-No pain         Complications: No notable events documented.

## 2022-04-26 NOTE — Anesthesia Procedure Notes (Signed)
Anesthesia Regional Block: TAP block   Pre-Anesthetic Checklist: , timeout performed,  Correct Patient, Correct Site, Correct Laterality,  Correct Procedure, Correct Position, site marked,  Risks and benefits discussed,  Surgical consent,  Pre-op evaluation,  At surgeon's request and post-op pain management  Laterality: Right  Prep: chloraprep       Needles:  Injection technique: Single-shot  Needle Type: Echogenic Stimulator Needle     Needle Length: 10cm  Needle Gauge: 21   Needle insertion depth: 9 cm   Additional Needles:   Procedures:,,,, ultrasound used (permanent image in chart),,    Narrative:  Start time: 04/26/2022 8:10 AM End time: 04/26/2022 8:15 AM Injection made incrementally with aspirations every 5 mL.  Performed by: Personally  Anesthesiologist: Mal Amabile, MD  Additional Notes: Timeout performed. Patient sedated. Relevant anatomy ID'd using [place]. Incremental 2-71ml injection of LA with frequent aspiration. Patient tolerated procedure well.

## 2022-04-26 NOTE — Anesthesia Procedure Notes (Signed)
Procedure Name: Intubation Date/Time: 04/26/2022 9:29 AM  Performed by: Zollie Beckers, CRNAPre-anesthesia Checklist: Patient identified, Emergency Drugs available, Suction available and Patient being monitored Patient Re-evaluated:Patient Re-evaluated prior to induction Oxygen Delivery Method: Circle system utilized Preoxygenation: Pre-oxygenation with 100% oxygen Induction Type: IV induction Ventilation: Mask ventilation without difficulty Laryngoscope Size: Mac and 3 Grade View: Grade II Tube type: Oral Tube size: 7.0 mm Number of attempts: 1 Airway Equipment and Method: Stylet Placement Confirmation: ETT inserted through vocal cords under direct vision, positive ETCO2 and breath sounds checked- equal and bilateral Secured at: 21 cm Tube secured with: Tape Dental Injury: Teeth and Oropharynx as per pre-operative assessment

## 2022-04-27 ENCOUNTER — Encounter (HOSPITAL_COMMUNITY): Payer: Self-pay | Admitting: General Surgery

## 2022-04-29 LAB — SURGICAL PATHOLOGY

## 2022-05-28 ENCOUNTER — Ambulatory Visit: Payer: No Typology Code available for payment source | Admitting: Physician Assistant

## 2022-07-11 ENCOUNTER — Ambulatory Visit (HOSPITAL_COMMUNITY): Payer: No Typology Code available for payment source

## 2022-07-11 ENCOUNTER — Ambulatory Visit (HOSPITAL_COMMUNITY)
Admission: EM | Admit: 2022-07-11 | Discharge: 2022-07-11 | Disposition: A | Discharge status: Home / Self Care | Payer: No Typology Code available for payment source | Attending: Internal Medicine | Admitting: Internal Medicine

## 2022-07-11 ENCOUNTER — Encounter (HOSPITAL_COMMUNITY): Payer: Self-pay

## 2022-07-11 DIAGNOSIS — J209 Acute bronchitis, unspecified: Secondary | ICD-10-CM | POA: Insufficient documentation

## 2022-07-11 DIAGNOSIS — R350 Frequency of micturition: Secondary | ICD-10-CM | POA: Diagnosis present

## 2022-07-11 LAB — POCT URINALYSIS DIP (MANUAL ENTRY)
Bilirubin, UA: NEGATIVE
Glucose, UA: NEGATIVE mg/dL
Ketones, POC UA: NEGATIVE mg/dL
Nitrite, UA: NEGATIVE
Protein Ur, POC: NEGATIVE mg/dL
Spec Grav, UA: 1.02 (ref 1.010–1.025)
Urobilinogen, UA: 0.2 E.U./dL
pH, UA: 7 (ref 5.0–8.0)

## 2022-07-11 MED ORDER — GUAIFENESIN-CODEINE 100-10 MG/5ML PO SOLN: 5.0000 mL | Freq: Every evening | ORAL | 0 refills | Status: DC

## 2022-07-11 MED ORDER — IPRATROPIUM-ALBUTEROL 0.5-2.5 (3) MG/3ML IN SOLN
3.0000 mL | Freq: Once | RESPIRATORY_TRACT | Status: AC
  Administered 2022-07-11: 3 mL via RESPIRATORY_TRACT

## 2022-07-11 MED ORDER — DOXYCYCLINE HYCLATE 100 MG PO CAPS: 100.0000 mg | ORAL_CAPSULE | Freq: Two times a day (BID) | ORAL | 0 refills | Status: DC

## 2022-07-11 MED ORDER — ALBUTEROL SULFATE HFA 108 (90 BASE) MCG/ACT IN AERS: 2.0000 | INHALATION_SPRAY | RESPIRATORY_TRACT | 0 refills | Status: AC | PRN

## 2022-07-11 MED ORDER — IPRATROPIUM-ALBUTEROL 0.5-2.5 (3) MG/3ML IN SOLN
RESPIRATORY_TRACT | Status: AC
  Filled 2022-07-11: qty 3

## 2022-07-11 MED ORDER — PREDNISONE 20 MG PO TABS: 20.0000 mg | ORAL_TABLET | Freq: Every day | ORAL | 0 refills | Status: DC

## 2022-07-11 NOTE — ED Provider Notes (Signed)
[facility]    CSN: [id number] Arrival date & time: 07/11/22  1525      History   Chief Complaint Chief Complaint  Patient presents with   Cough   Chills    HPI [name] is a 57 y.o. female who presents with hx of developing fever and HA x 2 days 2 weeks ago, mild rhinitis after that and been very fatigued. Two days ago she developed a non productive cough, and gradually has been feeling worse. Today has felt chills which is unusual for her since she is menopausal and never gets cold, and wheezing and SOB. She has ran out of her inhalers, so has not had anything to help the wheezing. She admits she is a smoker of 1/2 ppd. She had quit and started smoking year ago again.     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   Bilateral carpal  tunnel syndrome 06/24/2017   Primary osteoarthritis of first carpometacarpal joint of right hand 06/24/2017   Chronic low back pain 06/23/2012   COPD (chronic obstructive pulmonary disease) (HCC) 11/19/2010   Drug addiction in remission (HCC) 11/19/2010   Common migraine 03/23/2010   GERD (gastroesophageal reflux disease) 03/12/2010   Obesity (BMI 35.0-39.9 without comorbidity) 03/12/2010   Tobacco use disorder 12/11/2009   Major depression, recurrent (HCC) 03/03/2009    Past Surgical History:  Procedure Laterality Date   ABDOMINAL HYSTERECTOMY     CARPAL TUNNEL RELEASE Bilateral    11/2019   CERVICAL CONE BIOPSY  1996   Laser   CHOLECYSTECTOMY N/A 04/26/2022   Procedure: LAPAROSCOPIC CHOLECYSTECTOMY WITH ICG DYE;  Surgeon: Emelia Loron, MD;  Location: [facility];  Service: General;  Laterality: N/A;  GEN AND TAP BLOCK   COLONOSCOPY     LASER ABLATION OF THE CERVIX     MYRINGOTOMY     Bilateral, age 43 and 9   TONSILLECTOMY AND ADENOIDECTOMY     TOTAL VAGINAL HYSTERECTOMY  07/2007   TVH-menorrhagia, dysmenorrhea, still have fallopians tubes and ovaries   TUBAL LIGATION     Tubal Ligation/Steriization   VULVECTOMY N/A 09/25/2017   Procedure: PARTIAL VULVECTOMY;  Surgeon: Shonna Chock, MD;  Location: [facility];  Service: Gynecology;  Laterality: N/A;   WISDOM TOOTH EXTRACTION      OB History   No obstetric history on file.      Home Medications    Prior to Admission medications   Medication Sig Start Date End Date Taking? Authorizing Provider  acetaminophen (TYLENOL) 500 MG tablet Take 1,000 mg by mouth every 6 (six) hours as needed for moderate pain.   Yes [provider]  albuterol (VENTOLIN HFA) 108 (90 Base) MCG/ACT inhaler Inhale 2 puffs into the lungs every 4 (four) hours as needed for wheezing or shortness of breath. 07/11/22  Yes Rodriguez-Southworth, Nettie Elm, PA-C  doxycycline (VIBRAMYCIN) 100 MG capsule Take 1 capsule (100 mg total) by  mouth 2 (two) times daily. 07/11/22  Yes Rodriguez-Southworth, Nettie Elm, PA-C  guaiFENesin-codeine 100-10 MG/5ML syrup Take 5 mLs by mouth at bedtime. 07/11/22  Yes Rodriguez-Southworth, Nettie Elm, PA-C  predniSONE (DELTASONE) 20 MG tablet Take 1 tablet (20 mg total) by mouth daily with breakfast. 07/11/22  Yes Rodriguez-Southworth, Nettie Elm, PA-C  hydrochlorothiazide (HYDRODIURIL) 25 MG tablet Take 1 tablet (25 mg total) by mouth daily. 08/22/21   Nahser, Deloris Ping, MD  meclizine (ANTIVERT) 12.5 MG tablet Take 12.5 mg by mouth 3 (three) times daily as needed for dizziness.    [provider]  omeprazole (PRILOSEC) 40 MG capsule Take 1 capsule (40 mg total) by mouth 2 (two) times daily. 05/16/21   Zehr, Shanda Bumps D, PA-C  potassium chloride (KLOR-CON M) 10 MEQ tablet Take 1 tablet (10 mEq total) by mouth 2 (two) times daily. Patient taking differently: Take 10 mEq by mouth daily. 08/22/21   Nahser, Deloris Ping, MD  valsartan (DIOVAN) 80 MG tablet Take 1 tablet (80 mg total) by mouth daily. 08/22/21   Nahser, Deloris Ping, MD    Family History Family History  Problem Relation Age of Onset   Heart failure Mother    Diabetes Mother    Cancer Father    COPD Maternal Grandmother    Emphysema Maternal Grandmother    Arthritis Maternal Grandmother    Lung cancer Maternal Grandfather    Breast cancer Paternal Grandmother    Tuberculosis Paternal Grandmother    Stroke Paternal Grandmother    Heart failure Paternal Grandfather    High blood pressure Paternal Grandfather    Colon cancer Neg Hx    Colon polyps Neg Hx    Esophageal cancer Neg Hx    Rectal cancer Neg Hx    Stomach cancer Neg Hx     Social History Social History   Tobacco Use   Smoking status: Some Days    Packs/day: 0.50    Years: 40.00    Additional pack years: 0.00    Total pack years: 20.00    Types: Cigarettes    Last attempt to quit: 04/09/2021    Years since quitting: 1.2   Smokeless tobacco: Never   Tobacco comments:    Patient  smokes less than 1/2 pack of cigarettes a day  Vaping Use   Vaping Use: Never used  Substance Use Topics   Alcohol use: Yes    Comment: occasionally   Drug use: Not Currently    Types: Marijuana, "Crack" cocaine    Comment: 20 years clean per pt     Allergies   Ketorolac, Metronidazole, and Latex   Review of Systems Review of Systems  Constitutional:  Positive for appetite change, chills, diaphoresis, fatigue and fever. Negative for activity change.  HENT:  Negative for  congestion, ear discharge, ear pain and rhinorrhea.   Eyes:  Negative for discharge.  Respiratory:  Positive for cough, shortness of breath and wheezing.   Genitourinary:  Positive for frequency and urgency. Negative for dysuria.  Musculoskeletal:  Negative for neck pain and neck stiffness.  Neurological:  Positive for headaches.  Hematological:  Negative for adenopathy.     Physical Exam Triage Vital Signs ED Triage Vitals  Enc Vitals Group     BP 07/11/22 1629 (!) 159/96     Pulse --      Resp 07/11/22 1629 20     Temp 07/11/22 1629 (!) 100.8 F (38.2 C)     Temp src --      SpO2 07/11/22 1629 92 %     Weight 07/11/22 1629 209 lb (94.8 kg)     Height 07/11/22 1629 5\' 4"  (1.626 m)     Head Circumference --      Peak Flow --      Pain Score 07/11/22 1627 6     Pain Loc --      Pain Edu? --      Excl. in GC? --    No data found.  Updated Vital Signs BP (!) 159/96 (BP Location: Left Arm)   Temp (!) 100.8 F (38.2 C)   Resp 20   Ht 5\' 4"  (1.626 m)   Wt 209 lb (94.8 kg)   SpO2 92%   BMI 35.87 kg/m   Visual Acuity Right Eye Distance:   Left Eye Distance:   Bilateral Distance:    Right Eye Near:   Left Eye Near:    Bilateral Near:     Physical Exam Physical Exam Constitutional:      General: He is not in acute distress.    Appearance: He is not toxic-appearing.  HENT:     Head: Normocephalic.     Right Ear: Tympanic membrane, ear canal and external ear normal.     Left Ear: Ear  canal and external ear normal.     Nose: Nose normal.     Mouth/Throat:     Mouth: Mucous membranes are moist.     Pharynx: Oropharynx is clear.  Eyes:     General: No scleral icterus.    Conjunctiva/sclera: Conjunctivae normal.  Cardiovascular:     Rate and Rhythm: Normal rate and regular rhythm.     Heart sounds: No murmur heard.   Pulmonary:     Effort: Pulmonary effort is normal. No respiratory distress.     Breath sounds: Wheezing present.     Comments: Has auditory wheezing, which improved after duo neb treatment. Only mild wheeze present of LLL.  Musculoskeletal:        General: Normal range of motion.     Cervical back: Neck supple.  Lymphadenopathy:     Cervical: No cervical adenopathy.  Skin:    General: Skin is warm and dry.     Findings: No rash.  Neurological:     Mental Status: He is alert and oriented to person, place, and time.     Gait: Gait normal.  Psychiatric:        Mood and Affect: Mood normal.        Behavior: Behavior normal.        Thought Content: Thought content normal.        Judgment: Judgment normal.    UC Treatments / Results  Labs (all labs ordered are listed, but only abnormal results are displayed) Labs  Reviewed  POCT URINALYSIS DIP (MANUAL ENTRY) - Abnormal; Notable for the following components:      Result Value   Blood, UA small (*)    Leukocytes, UA Small (1+) (*)    All other components within normal limits  URINE CULTURE    EKG   Radiology DG Chest 2 View  Result Date: 07/11/2022 CLINICAL DATA:  fever, hemoptysis, SOB EXAM: CHEST - 2 VIEW COMPARISON:  09/20/2021 FINDINGS: The heart size and mediastinal contours are within normal limits. Diffusely coarsened interstitial markings, similar to to prior. No focal airspace consolidation. No pleural effusion or pneumothorax. The visualized skeletal structures are unremarkable. IMPRESSION: Bronchitic type lung changes, similar to prior. No focal airspace consolidation. Electronically  Signed   By: Duanne Guess D.O.   On: 07/11/2022 16:59    Procedures Procedures (including critical care time)  Medications Ordered in UC Medications  ipratropium-albuterol (DUONEB) 0.5-2.5 (3) MG/3ML nebulizer solution 3 mL (3 mLs Nebulization Given 07/11/22 1659)    Initial Impression / Assessment and Plan / UC Course  I have reviewed the triage vital signs and the nursing notes.  Pertinent  imaging results that were available during my care of the patient were reviewed by me and considered in my medical decision making (see chart for details).  Acute bronchitis Urinary frequency and urgency  I sent her urine for a culture I placed her on Doxy, Prednisone, and Albuterol inhaler as noted and encouraged her to stop smoking.  We will call her if her urine culture is positive.   Final Clinical Impressions(s) / UC Diagnoses   Final diagnoses:  Acute bronchitis, unspecified organism  Urinary frequency     Discharge Instructions      If no fever in 24 hours      ED Prescriptions     Medication Sig Dispense Auth. Provider   albuterol (VENTOLIN HFA) 108 (90 Base) MCG/ACT inhaler Inhale 2 puffs into the lungs every 4 (four) hours as needed for wheezing or shortness of breath. 1 each Rodriguez-Southworth, Nettie Elm, PA-C   doxycycline (VIBRAMYCIN) 100 MG capsule Take 1 capsule (100 mg total) by mouth 2 (two) times daily. 20 capsule Rodriguez-Southworth, Nettie Elm, PA-C   predniSONE (DELTASONE) 20 MG tablet Take 1 tablet (20 mg total) by mouth daily with breakfast. 5 tablet Rodriguez-Southworth, Armonte Tortorella, PA-C   guaiFENesin-codeine 100-10 MG/5ML syrup Take 5 mLs by mouth at bedtime. 120 mL Rodriguez-Southworth, Nettie Elm, PA-C      I have reviewed the PDMP during this encounter.   Garey Ham, [place] 07/11/22 1720

## 2022-07-11 NOTE — ED Triage Notes (Signed)
Cough, chest pain, chills onset 2 weeks ago with feeling feverish and chills. Onset of cough yesterday coughing all night. Woke up with chest feeling raw and a metallic taste in the mouth.   Worsening symptoms throughout today with SOB.   No known sick exposure. No meds tried.

## 2022-07-11 NOTE — Discharge Instructions (Addendum)
If no fever in 24 hours

## 2022-07-13 LAB — URINE CULTURE

## 2022-07-14 LAB — URINE CULTURE: Culture: >=100000 — AB

## 2022-07-16 ENCOUNTER — Telehealth (HOSPITAL_COMMUNITY): Payer: Self-pay | Admitting: Emergency Medicine

## 2022-07-16 MED ORDER — SULFAMETHOXAZOLE-TRIMETHOPRIM 800-160 MG PO TABS: 1.0000 | ORAL_TABLET | Freq: Two times a day (BID) | ORAL | 0 refills | Status: AC

## 2022-07-17 ENCOUNTER — Ambulatory Visit (INDEPENDENT_AMBULATORY_CARE_PROVIDER_SITE_OTHER): Payer: No Typology Code available for payment source

## 2022-07-17 ENCOUNTER — Ambulatory Visit (HOSPITAL_COMMUNITY)
Admission: RE | Admit: 2022-07-17 | Discharge: 2022-07-17 | Disposition: A | Discharge status: Home / Self Care | Payer: No Typology Code available for payment source | Source: Ambulatory Visit | Attending: Emergency Medicine | Admitting: Emergency Medicine

## 2022-07-17 ENCOUNTER — Encounter (HOSPITAL_COMMUNITY): Payer: Self-pay

## 2022-07-17 VITALS — BP 152/92 | HR 91 | Temp 98.5°F | Resp 20 | Ht 64.0 in | Wt 209.0 lb

## 2022-07-17 DIAGNOSIS — T3695XA Adverse effect of unspecified systemic antibiotic, initial encounter: Secondary | ICD-10-CM | POA: Diagnosis not present

## 2022-07-17 DIAGNOSIS — R052 Subacute cough: Secondary | ICD-10-CM | POA: Diagnosis not present

## 2022-07-17 DIAGNOSIS — B379 Candidiasis, unspecified: Secondary | ICD-10-CM

## 2022-07-17 DIAGNOSIS — J42 Unspecified chronic bronchitis: Secondary | ICD-10-CM

## 2022-07-17 MED ORDER — IPRATROPIUM-ALBUTEROL 0.5-2.5 (3) MG/3ML IN SOLN
RESPIRATORY_TRACT | Status: AC
  Filled 2022-07-17: qty 3

## 2022-07-17 MED ORDER — FLUCONAZOLE 150 MG PO TABS: 150.0000 mg | ORAL_TABLET | Freq: Once | ORAL | 0 refills | Status: DC | PRN

## 2022-07-17 MED ORDER — IPRATROPIUM-ALBUTEROL 0.5-2.5 (3) MG/3ML IN SOLN
3.0000 mL | Freq: Once | RESPIRATORY_TRACT | Status: AC
  Administered 2022-07-17: 3 mL via RESPIRATORY_TRACT

## 2022-07-17 MED ORDER — BENZONATATE 100 MG PO CAPS: 100.0000 mg | ORAL_CAPSULE | Freq: Three times a day (TID) | ORAL | 0 refills | Status: DC | PRN

## 2022-07-17 MED ORDER — PREDNISONE 20 MG PO TABS: 40.0000 mg | ORAL_TABLET | Freq: Every day | ORAL | 0 refills | Status: AC

## 2022-07-17 NOTE — ED Provider Notes (Signed)
[facility]    CSN: [id number] Arrival date & time: 07/17/22  1026      History   Chief Complaint Chief Complaint  Patient presents with   Cough    Bronchitis still causing breathing/shortness of breath issues and coughing. - Entered by patient    HPI [name] is a 57 y.o. female.  Here with continued shortness of breath and cough She was seen 6/27, at that time reporting 2 weeks of symptoms.  She had a chest x-ray suggesting bronchitis.  Was prescribed prednisone, albuterol inhaler, doxycycline BID x 10. She has taken 6 days of the antibiotic. Reports symptoms feel worse, and keep her up at night Had codeine cough syrup but she cannot take because it keeps her awake.   Also reporting symptoms of yeast infection, likely from antibiotic. Requesting fluconazole  Additionally was having UTI symptoms and her urine culture resulted with e.coli. 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   Bilateral carpal tunnel syndrome 06/24/2017   Primary osteoarthritis of first carpometacarpal joint of right hand 06/24/2017   Chronic low back pain 06/23/2012   COPD (chronic obstructive pulmonary disease) (HCC) 11/19/2010   Drug addiction in remission (HCC) 11/19/2010   Common migraine 03/23/2010   GERD (gastroesophageal reflux disease) 03/12/2010   Obesity (BMI 35.0-39.9 without comorbidity) 03/12/2010   Tobacco use disorder 12/11/2009   Major depression, recurrent (HCC) 03/03/2009    Past Surgical History:  Procedure Laterality Date   ABDOMINAL HYSTERECTOMY     CARPAL TUNNEL RELEASE Bilateral    11/2019   CERVICAL CONE BIOPSY  1996   Laser   CHOLECYSTECTOMY N/A 04/26/2022   Procedure: LAPAROSCOPIC CHOLECYSTECTOMY WITH ICG DYE;  Surgeon: Emelia Loron, MD;  Location: [facility];  Service: General;  Laterality: N/A;  GEN AND TAP BLOCK   COLONOSCOPY     LASER ABLATION OF THE CERVIX     MYRINGOTOMY     Bilateral, age 39 and 9   TONSILLECTOMY AND ADENOIDECTOMY     TOTAL VAGINAL HYSTERECTOMY  07/2007   TVH-menorrhagia, dysmenorrhea, still have fallopians tubes and ovaries   TUBAL LIGATION  Tubal Ligation/Steriization   VULVECTOMY N/A 09/25/2017   Procedure: PARTIAL VULVECTOMY;  Surgeon: Shonna Chock, MD;  Location: [facility];  Service: Gynecology;  Laterality: N/A;   WISDOM TOOTH EXTRACTION      OB History   No obstetric history on file.      Home Medications    Prior to Admission medications   Medication Sig Start Date End Date Taking? Authorizing Provider  benzonatate (TESSALON) 100 MG capsule Take 1 capsule (100 mg total) by mouth 3 (three) times daily as needed for cough. 07/17/22  Yes Naylea Wigington, Lurena Joiner, PA-C  fluconazole (DIFLUCAN) 150 MG tablet  Take 1 tablet (150 mg total) by mouth once as needed for up to 2 doses (take one pill on day 1, and the second pill 3 days later). 07/17/22  Yes Jabril Pursell, Lurena Joiner, PA-C  predniSONE (DELTASONE) 20 MG tablet Take 2 tablets (40 mg total) by mouth daily with breakfast for 5 days. 07/17/22 07/22/22 Yes Abhishek Levesque, Lurena Joiner, PA-C  acetaminophen (TYLENOL) 500 MG tablet Take 1,000 mg by mouth every 6 (six) hours as needed for moderate pain.    [provider]  albuterol (VENTOLIN HFA) 108 (90 Base) MCG/ACT inhaler Inhale 2 puffs into the lungs every 4 (four) hours as needed for wheezing or shortness of breath. 07/11/22   Rodriguez-Southworth, Nettie Elm, PA-C  doxycycline (VIBRAMYCIN) 100 MG capsule Take 1 capsule (100 mg total) by mouth 2 (two) times daily. 07/11/22   Rodriguez-Southworth, Nettie Elm, PA-C  guaiFENesin-codeine 100-10 MG/5ML syrup Take 5 mLs by mouth at bedtime. 07/11/22   Rodriguez-Southworth, Nettie Elm, PA-C  hydrochlorothiazide (HYDRODIURIL) 25 MG tablet Take 1 tablet (25 mg total) by mouth daily. 08/22/21   Nahser, Deloris Ping, MD  meclizine (ANTIVERT) 12.5 MG tablet Take 12.5 mg by mouth 3 (three) times daily as needed for dizziness.    [provider]  omeprazole (PRILOSEC) 40 MG capsule Take 1 capsule (40 mg total) by mouth 2 (two) times daily. 05/16/21   Zehr, Shanda Bumps D, PA-C  potassium chloride (KLOR-CON M) 10 MEQ tablet Take 1 tablet (10 mEq total) by mouth 2 (two) times daily. Patient taking differently: Take 10 mEq by mouth daily. 08/22/21   Nahser, Deloris Ping, MD  sulfamethoxazole-trimethoprim (BACTRIM DS) 800-160 MG tablet Take 1 tablet by mouth 2 (two) times daily for 3 days. 07/16/22 07/19/22  Rodriguez-Southworth, Nettie Elm, PA-C  valsartan (DIOVAN) 80 MG tablet Take 1 tablet (80 mg total) by mouth daily. 08/22/21   Nahser, Deloris Ping, MD    Family History Family History  Problem Relation Age of Onset   Heart failure Mother    Diabetes Mother    Cancer Father    COPD Maternal Grandmother     Emphysema Maternal Grandmother    Arthritis Maternal Grandmother    Lung cancer Maternal Grandfather    Breast cancer Paternal Grandmother    Tuberculosis Paternal Grandmother    Stroke Paternal Grandmother    Heart failure Paternal Grandfather    High blood pressure Paternal Grandfather    Colon cancer Neg Hx    Colon polyps Neg Hx    Esophageal cancer Neg Hx    Rectal cancer Neg Hx    Stomach cancer Neg Hx     Social History Social History   Tobacco Use   Smoking status: Some Days    Packs/day: 0.50    Years: 40.00    Additional pack years: 0.00    Total pack years: 20.00    Types: Cigarettes    Last  attempt to quit: 04/09/2021    Years since quitting: 1.2   Smokeless tobacco: Never   Tobacco comments:    Patient smokes less than 1/2 pack of cigarettes a day  Vaping Use   Vaping Use: Never used  Substance Use Topics   Alcohol use: Yes    Comment: occasionally   Drug use: Not Currently    Types: Marijuana, "Crack" cocaine    Comment: 20 years clean per pt     Allergies   Ketorolac, Metronidazole, and Latex   Review of Systems Review of Systems Per HPI  Physical Exam Triage Vital Signs ED Triage Vitals  Enc Vitals Group     BP 07/17/22 1040 (!) 152/92     Pulse Rate 07/17/22 1040 91     Resp 07/17/22 1040 20     Temp 07/17/22 1040 98.5 F (36.9 C)     Temp Source 07/17/22 1040 Oral     SpO2 07/17/22 1040 95 %     Weight 07/17/22 1040 209 lb (94.8 kg)     Height 07/17/22 1040 5\' 4"  (1.626 m)     Head Circumference --      Peak Flow --      Pain Score 07/17/22 1039 0     Pain Loc --      Pain Edu? --      Excl. in GC? --    No data found.  Updated Vital Signs BP (!) 152/92 (BP Location: Right Arm)   Pulse 91   Temp 98.5 F (36.9 C) (Oral)   Resp 20   Ht 5\' 4"  (1.626 m)   Wt 209 lb (94.8 kg)   SpO2 95%   BMI 35.87 kg/m    Physical Exam Vitals and nursing note reviewed.  Constitutional:      General: She is not in acute distress.     Appearance: She is not ill-appearing.  HENT:     Right Ear: Tympanic membrane and ear canal normal.     Left Ear: Tympanic membrane and ear canal normal.     Nose: No congestion or rhinorrhea.     Mouth/Throat:     Mouth: Mucous membranes are moist.     Pharynx: Oropharynx is clear. No posterior oropharyngeal erythema.  Eyes:     Conjunctiva/sclera: Conjunctivae normal.  Cardiovascular:     Rate and Rhythm: Normal rate and regular rhythm.     Pulses: Normal pulses.     Heart sounds: Normal heart sounds.  Pulmonary:     Effort: Tachypnea present.     Breath sounds: Examination of the right-upper field reveals wheezing. Examination of the left-upper field reveals wheezing. Wheezing present.     Comments: Prior to [facility] patient with increased WOB and labored. Speaking in full sentences. After treatment breathing effort is normal. Upper lung expiratory wheezes. Faint crackles lower lobes.  Musculoskeletal:     Cervical back: Normal range of motion.  Lymphadenopathy:     Cervical: No cervical adenopathy.  Skin:    General: Skin is warm and dry.  Neurological:     Mental Status: She is alert and oriented to person, place, and time.      UC Treatments / Results  Labs (all labs ordered are listed, but only abnormal results are displayed) Labs Reviewed - No data to display  EKG   Radiology DG Chest 2 View  Result Date: 07/17/2022 CLINICAL DATA:  Shortness of breath and cough. EXAM: CHEST - 2 VIEW COMPARISON:  07/11/2022 FINDINGS:  Heart size and mediastinal contours are unremarkable. There is no pleural fluid, interstitial edema or airspace disease. Chronic bronchial wall thickening appears similar to previous exam. The visualized osseous structures appear unremarkable. IMPRESSION: Chronic bronchial wall thickening. No acute cardiopulmonary disease. Electronically Signed   By: Signa Kell M.D.   On: 07/17/2022 11:37    Procedures Procedures (including critical care  time)  Medications Ordered in UC Medications  ipratropium-albuterol (DUONEB) 0.5-2.5 (3) MG/3ML nebulizer solution 3 mL (3 mLs Nebulization Given 07/17/22 1114)    Initial Impression / Assessment and Plan / UC Course  I have reviewed the triage vital signs and the nursing notes.  Pertinent labs & imaging results that were available during my care of the patient were reviewed by me and considered in my medical decision making (see chart for details).  She had fever at prior visit but is afebrile today Sating 95% on room air, improved from visit 6 days ago (92%)  Duoneb given with much improvement. Breathing is regular and unlabored. Patient reports easier to breathe.   Repeat chest xray today negative for acute change. Compared to reading from 6 days ago. Chronic bronchial wall thickening. Images independently reviewed by me, agree with radiology interpretation.  Will continue Bactrim prescription for UTI. Sent fluconazole 2 dose for yeast. New prednisone prescription, increase to 40 mg daily x 5. Was previously on 20 mg daily.  Tessalon TID prn  Discussed if symptoms continue to worsen she needs to be seen in the ED. Patient agreeable to plan. All questions answered   Final Clinical Impressions(s) / UC Diagnoses   Final diagnoses:  Antibiotic-induced yeast infection  Chronic bronchitis, unspecified chronic bronchitis type (HCC)  Subacute cough     Discharge Instructions      Fluconazole as prescribed   Take the Bactrim as prescribed for the urinary tract infection  You can try the cough pills three times daily. If they make you drowsy, take only one at bedtime.   I am putting you on a higher dose steroid to reduce lung inflammation. Take once daily for 5 days Continue using your inhaler every 6 hours  Please call your primary care provider for follow up. Please go directly to the emergency department if symptoms worsen.    ED Prescriptions     Medication Sig  Dispense Auth. Provider   fluconazole (DIFLUCAN) 150 MG tablet Take 1 tablet (150 mg total) by mouth once as needed for up to 2 doses (take one pill on day 1, and the second pill 3 days later). 2 tablet Venice Marcucci, PA-C   predniSONE (DELTASONE) 20 MG tablet Take 2 tablets (40 mg total) by mouth daily with breakfast for 5 days. 10 tablet Sontee Desena, PA-C   benzonatate (TESSALON) 100 MG capsule Take 1 capsule (100 mg total) by mouth 3 (three) times daily as needed for cough. 21 capsule Kailey Esquilin, Lurena Joiner, PA-C      PDMP not reviewed this encounter.   Kawika Bischoff, [place] 07/17/22 2008

## 2022-07-17 NOTE — ED Triage Notes (Signed)
Patient here today with c/o continued symptoms of Bronchitis. She is still having SOB and coughing. She has still has 4 days left of the first antibiotics. She would also like to have something for a yeast injection. She is requesting a prescription for Fluconazole.

## 2022-07-17 NOTE — Discharge Instructions (Addendum)
Fluconazole as prescribed   Take the Bactrim as prescribed for the urinary tract infection  You can try the cough pills three times daily. If they make you drowsy, take only one at bedtime.   I am putting you on a higher dose steroid to reduce lung inflammation. Take once daily for 5 days Continue using your inhaler every 6 hours  Please call your primary care provider for follow up. Please go directly to the emergency department if symptoms worsen.

## 2022-07-26 ENCOUNTER — Ambulatory Visit (INDEPENDENT_AMBULATORY_CARE_PROVIDER_SITE_OTHER): Payer: No Typology Code available for payment source | Admitting: Family Medicine

## 2022-07-26 ENCOUNTER — Encounter: Payer: Self-pay | Admitting: Family Medicine

## 2022-07-26 VITALS — BP 140/100 | HR 88 | Temp 98.5°F | Ht 64.0 in | Wt 207.5 lb

## 2022-07-26 DIAGNOSIS — K219 Gastro-esophageal reflux disease without esophagitis: Secondary | ICD-10-CM

## 2022-07-26 DIAGNOSIS — N951 Menopausal and female climacteric states: Secondary | ICD-10-CM

## 2022-07-26 DIAGNOSIS — I10 Essential (primary) hypertension: Secondary | ICD-10-CM | POA: Diagnosis not present

## 2022-07-26 DIAGNOSIS — R635 Abnormal weight gain: Secondary | ICD-10-CM

## 2022-07-26 DIAGNOSIS — F331 Major depressive disorder, recurrent, moderate: Secondary | ICD-10-CM | POA: Diagnosis not present

## 2022-07-26 DIAGNOSIS — N3941 Urge incontinence: Secondary | ICD-10-CM

## 2022-07-26 MED ORDER — OXYBUTYNIN CHLORIDE ER 5 MG PO TB24
5.0000 mg | ORAL_TABLET | Freq: Every day | ORAL | 1 refills | Status: DC
Start: 2022-07-26 — End: 2022-12-03

## 2022-07-26 MED ORDER — DULOXETINE HCL 60 MG PO CPEP
60.0000 mg | ORAL_CAPSULE | Freq: Every day | ORAL | 3 refills | Status: DC
Start: 2022-08-08 — End: 2022-12-03

## 2022-07-26 MED ORDER — DULOXETINE HCL 30 MG PO CPEP
30.0000 mg | ORAL_CAPSULE | Freq: Every day | ORAL | 0 refills | Status: DC
Start: 2022-07-26 — End: 2022-12-03

## 2022-07-26 MED ORDER — OMEPRAZOLE 40 MG PO CPDR
40.0000 mg | DELAYED_RELEASE_CAPSULE | Freq: Every day | ORAL | 1 refills | Status: DC
Start: 2022-07-26 — End: 2023-03-04

## 2022-07-26 MED ORDER — VALSARTAN 80 MG PO TABS
80.0000 mg | ORAL_TABLET | Freq: Every day | ORAL | Status: DC
Start: 2022-07-26 — End: 2022-10-07

## 2022-07-26 MED ORDER — ESTRADIOL 1 MG PO TABS
1.0000 mg | ORAL_TABLET | Freq: Every day | ORAL | 1 refills | Status: DC
Start: 2022-07-26 — End: 2023-03-04

## 2022-07-26 NOTE — Progress Notes (Signed)
Established Patient Office Visit  Subjective   Patient ID: [name], female    DOB: Jun 07, 1965  Age: 57 y.o. MRN: [id number]  Chief Complaint  Patient presents with   Establish Care    Patient reports a hysterectomy 15 years ago, states still has tubes and ovaries. Started having symptoms of menopause about 5 years ago. Irritability significantly increased hot flashes, night sweats, is having them every night and even during the day. Increased appetite, weight gain. Has been on gabapentin and lexapro in the past, did not do well -- got a lot of brain fog from these medications. States that she was a smoker, states that she got bronchitis last week, decided to quit smoking. States she is on nicotine patches a nicotine gum. States that she is working on quitting smoking. We discussed the risk of DVT when using estrogen in smokers. States she has not had a cigarette in over 1 weeks since she was sick with bronchitis.   PHQ and GAD were both elevated. She reports that her symptoms are worse she thinks with the menopausal changes.   Flowsheet Row Office Visit from 07/26/2022 in [facility]  [facility]  PHQ-9 Total Score 19    HTN-- BP is elevated today, she reports she has been on medication for her blood pressure in the past. We reviewed this and will restart her BP medication. She denies any SOB, no chest pain or other symptoms.   Patient would also like refills on her PPI.  Current Outpatient Medications  Medication Instructions   acetaminophen (TYLENOL) 1,000 mg, Oral, Every 6 hours PRN   albuterol (VENTOLIN HFA) 108 (90 Base) MCG/ACT inhaler 2 puffs, Inhalation, Every 4 hours PRN   DULoxetine (CYMBALTA) 30 mg, Oral, Daily   [START ON 08/08/2022] DULoxetine (CYMBALTA) 60 mg, Oral, Daily   estradiol (ESTRACE) 1 mg, Oral, Daily   fluconazole (DIFLUCAN) 150 mg, Oral, Once PRN   meclizine (ANTIVERT) 12.5 mg, Oral, 3 times daily PRN   omeprazole (PRILOSEC) 40  mg, Oral, Daily   oxybutynin (DITROPAN-XL) 5 mg, Oral, Daily at bedtime   valsartan (DIOVAN) 80 mg, Oral, Daily    Patient Active Problem List   Diagnosis Date Noted   Primary hypertension 07/29/2022   Urge incontinence 07/29/2022   Abdominal pain, epigastric 05/25/2021   Nausea and vomiting 05/25/2021   Loose stools 05/25/2021   Menopausal symptoms 07/05/2020   Facet arthritis of cervical region 09/09/2019   Tubular adenoma of colon 06/16/2019   Urinary incontinence 04/09/2019   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     Objective:     BP (!) 140/100 (BP Location: Left Arm, Patient Position: Sitting, Cuff Size: Large)   Pulse 88   Temp 98.5 F (36.9 C) (Oral)   Ht 5\' 4"  (1.626 m)   Wt 207 lb 8 oz (94.1 kg)   SpO2 97%   BMI 35.62 kg/m  BP Readings from Last 3  Encounters:  07/26/22 (!) 140/100  07/17/22 (!) 152/92  07/11/22 (!) 159/96      Physical Exam Vitals reviewed.  Constitutional:      Appearance: Normal appearance. She is well-groomed and normal weight.  Eyes:     Conjunctiva/sclera: Conjunctivae normal.  Neck:     Thyroid: No thyromegaly.  Cardiovascular:     Rate and Rhythm: Normal rate and regular rhythm.     Pulses: Normal pulses.     Heart sounds: S1 normal and S2 normal.  Pulmonary:     Effort: Pulmonary effort is normal.     Breath sounds: Normal breath sounds and air  entry.  Musculoskeletal:     Right lower leg: No edema.     Left lower leg: No edema.  Neurological:     Mental Status: She is alert and oriented to person, place, and time. Mental status is at baseline.     Gait: Gait is intact.  Psychiatric:        Mood and Affect: Mood and affect normal.        Speech: Speech normal.        Behavior: Behavior normal.        Judgment: Judgment normal.      No results found for any visits on 07/26/22.    The 10-year ASCVD risk score (Arnett DK, et al., 2019) is: 7.9%    Assessment & Plan:  Menopausal symptoms Assessment & Plan: We discussed the risks/benefits of estrogen therapy and she would like to try HRT. Will start 1 mg daily, I strongly advised her to continue smoking cessation due to the risk of DVT  Orders: -     Estradiol; Take 1 tablet (1 mg total) by mouth daily.  Dispense: 90 tablet; Refill: 1  Moderate episode of recurrent major depressive disorder [facility]) Assessment & Plan: We discussed medication that could help her depression and anxiety symptoms. Will start cymbalta and see her back in 6 weeks to re-evaluate her symptoms and her blood pressure  Orders: -     DULoxetine HCl; Take 1 capsule (30 mg total) by mouth daily for 14 days.  Dispense: 14 capsule; Refill: 0 -     DULoxetine HCl; Take 1 capsule (60 mg total) by mouth daily.  Dispense: 30 capsule; Refill: 3 -     CBC with Differential/Platelet; Future -     TSH; Future  Gastroesophageal reflux disease without esophagitis -     Omeprazole; Take 1 capsule (40 mg total) by mouth daily.  Dispense: 90 capsule; Refill: 1  Primary hypertension Assessment & Plan: Off her meds currently, will restart valsartan 80 mg daily. I have ordered her annual blood work as well. Will repeat BP in 6 weeks  Orders: -     Valsartan; Take 1 tablet (80 mg total) by mouth daily. -     Lipid panel; Future -     Comprehensive metabolic panel; Future  Urge incontinence Assessment &  Plan: Has had this for at least 4 years, pt is asking today for a medication to help with this. Will start oxybutinin 5 mg at bedtime to help with incontinence.   Orders: -     oxyBUTYnin Chloride ER; Take 1 tablet (5 mg total) by mouth at bedtime.  Dispense: 90 tablet; Refill: 1  Weight gain -     Hemoglobin A1c; Future     Return in about 6 weeks (around 09/06/2022) for HTN, recheck.    Karie Georges, MD

## 2022-07-29 DIAGNOSIS — I10 Essential (primary) hypertension: Secondary | ICD-10-CM | POA: Insufficient documentation

## 2022-07-29 DIAGNOSIS — N3941 Urge incontinence: Secondary | ICD-10-CM | POA: Insufficient documentation

## 2022-07-29 NOTE — Assessment & Plan Note (Signed)
Has had this for at least 4 years, pt is asking today for a medication to help with this. Will start oxybutinin 5 mg at bedtime to help with incontinence.

## 2022-07-29 NOTE — Assessment & Plan Note (Signed)
Off her meds currently, will restart valsartan 80 mg daily. I have ordered her annual blood work as well. Will repeat BP in 6 weeks

## 2022-07-29 NOTE — Assessment & Plan Note (Signed)
We discussed the risks/benefits of estrogen therapy and she would like to try HRT. Will start 1 mg daily, I strongly advised her to continue smoking cessation due to the risk of DVT

## 2022-07-29 NOTE — Assessment & Plan Note (Signed)
We discussed medication that could help her depression and anxiety symptoms. Will start cymbalta and see her back in 6 weeks to re-evaluate her symptoms and her blood pressure

## 2022-08-05 ENCOUNTER — Other Ambulatory Visit (INDEPENDENT_AMBULATORY_CARE_PROVIDER_SITE_OTHER): Payer: No Typology Code available for payment source

## 2022-08-05 DIAGNOSIS — F331 Major depressive disorder, recurrent, moderate: Secondary | ICD-10-CM | POA: Diagnosis not present

## 2022-08-05 DIAGNOSIS — I10 Essential (primary) hypertension: Secondary | ICD-10-CM | POA: Diagnosis not present

## 2022-08-05 DIAGNOSIS — R635 Abnormal weight gain: Secondary | ICD-10-CM | POA: Diagnosis not present

## 2022-08-06 LAB — CBC WITH DIFFERENTIAL/PLATELET
Basophils Absolute: 0.1 10*3/uL (ref 0.0–0.1)
Basophils Relative: 1.3 % (ref 0.0–3.0)
Eosinophils Absolute: 0.4 10*3/uL (ref 0.0–0.7)
Eosinophils Relative: 4.6 % (ref 0.0–5.0)
HCT: 42.7 % (ref 36.0–46.0)
Hemoglobin: 14 g/dL (ref 12.0–15.0)
Lymphocytes Relative: 36.1 % (ref 12.0–46.0)
Lymphs Abs: 3 10*3/uL (ref 0.7–4.0)
MCHC: 32.9 g/dL (ref 30.0–36.0)
MCV: 92.9 fl (ref 78.0–100.0)
Monocytes Absolute: 0.6 10*3/uL (ref 0.1–1.0)
Monocytes Relative: 7.2 % (ref 3.0–12.0)
Neutro Abs: 4.2 10*3/uL (ref 1.4–7.7)
Neutrophils Relative %: 50.8 % (ref 43.0–77.0)
Platelets: 229 10*3/uL (ref 150.0–400.0)
RBC: 4.6 Mil/uL (ref 3.87–5.11)
RDW: 13.9 % (ref 11.5–15.5)
WBC: 8.2 10*3/uL (ref 4.0–10.5)

## 2022-08-06 LAB — COMPREHENSIVE METABOLIC PANEL
ALT: 20 U/L (ref 0–35)
AST: 19 U/L (ref 0–37)
Albumin: 4.4 g/dL (ref 3.5–5.2)
Alkaline Phosphatase: 61 U/L (ref 39–117)
BUN: 13 mg/dL (ref 6–23)
CO2: 28 mEq/L (ref 19–32)
Calcium: 9.8 mg/dL (ref 8.4–10.5)
Chloride: 102 mEq/L (ref 96–112)
Creatinine, Ser: 0.95 mg/dL (ref 0.40–1.20)
GFR: 67.54 mL/min (ref >60.00)
Glucose, Bld: 80 mg/dL (ref 70–99)
Potassium: 4.4 mEq/L (ref 3.5–5.1)
Sodium: 138 mEq/L (ref 135–145)
Total Bilirubin: 0.5 mg/dL (ref 0.2–1.2)
Total Protein: 7 g/dL (ref 6.0–8.3)

## 2022-08-06 LAB — HEMOGLOBIN A1C: Hgb A1c MFr Bld: 6.1 % (ref 4.6–6.5)

## 2022-08-06 LAB — LIPID PANEL
Cholesterol: 230 mg/dL — ABNORMAL HIGH (ref 0–200)
HDL: 49.2 mg/dL (ref >39.00)
NonHDL: 180.31
Total CHOL/HDL Ratio: 5
Triglycerides: 224 mg/dL — ABNORMAL HIGH (ref 0.0–149.0)
VLDL: 44.8 mg/dL — ABNORMAL HIGH (ref 0.0–40.0)

## 2022-08-06 LAB — TSH: TSH: 4.74 u[IU]/mL (ref 0.35–5.50)

## 2022-08-06 LAB — LDL CHOLESTEROL, DIRECT: Direct LDL: 140 mg/dL

## 2022-08-14 ENCOUNTER — Encounter: Payer: No Typology Code available for payment source | Admitting: Family Medicine

## 2022-08-29 ENCOUNTER — Encounter (INDEPENDENT_AMBULATORY_CARE_PROVIDER_SITE_OTHER): Payer: Self-pay

## 2022-09-20 ENCOUNTER — Ambulatory Visit: Payer: No Typology Code available for payment source | Admitting: Family Medicine

## 2022-10-07 ENCOUNTER — Other Ambulatory Visit: Payer: Self-pay

## 2022-10-07 ENCOUNTER — Other Ambulatory Visit: Payer: Self-pay | Admitting: Cardiovascular Disease

## 2022-10-07 DIAGNOSIS — I10 Essential (primary) hypertension: Secondary | ICD-10-CM

## 2022-10-07 MED ORDER — VALSARTAN 80 MG PO TABS
80.0000 mg | ORAL_TABLET | Freq: Every day | ORAL | 0 refills | Status: DC
Start: 2022-10-07 — End: 2022-12-03

## 2022-10-28 ENCOUNTER — Ambulatory Visit: Payer: No Typology Code available for payment source | Admitting: Family Medicine

## 2022-11-19 ENCOUNTER — Encounter (INDEPENDENT_AMBULATORY_CARE_PROVIDER_SITE_OTHER): Payer: Self-pay

## 2022-11-21 ENCOUNTER — Ambulatory Visit (HOSPITAL_COMMUNITY)
Admission: EM | Admit: 2022-11-21 | Discharge: 2022-11-21 | Disposition: A | Discharge status: Home / Self Care | Payer: BC Managed Care – PPO | Attending: Emergency Medicine | Admitting: Emergency Medicine

## 2022-11-21 ENCOUNTER — Ambulatory Visit (HOSPITAL_COMMUNITY): Payer: BC Managed Care – PPO

## 2022-11-21 ENCOUNTER — Encounter (HOSPITAL_COMMUNITY): Payer: Self-pay | Admitting: Emergency Medicine

## 2022-11-21 DIAGNOSIS — R0602 Shortness of breath: Secondary | ICD-10-CM | POA: Diagnosis not present

## 2022-11-21 DIAGNOSIS — J441 Chronic obstructive pulmonary disease with (acute) exacerbation: Secondary | ICD-10-CM

## 2022-11-21 DIAGNOSIS — R059 Cough, unspecified: Secondary | ICD-10-CM

## 2022-11-21 DIAGNOSIS — R062 Wheezing: Secondary | ICD-10-CM | POA: Diagnosis not present

## 2022-11-21 MED ORDER — BUDESONIDE-FORMOTEROL FUMARATE 80-4.5 MCG/ACT IN AERO: 2.0000 | INHALATION_SPRAY | Freq: Two times a day (BID) | RESPIRATORY_TRACT | 1 refills | Status: DC

## 2022-11-21 MED ORDER — IPRATROPIUM-ALBUTEROL 0.5-2.5 (3) MG/3ML IN SOLN
RESPIRATORY_TRACT | Status: AC
  Filled 2022-11-21: qty 3

## 2022-11-21 MED ORDER — PREDNISONE 10 MG (21) PO TBPK: ORAL_TABLET | Freq: Every day | ORAL | 0 refills | Status: DC

## 2022-11-21 MED ORDER — IPRATROPIUM-ALBUTEROL 0.5-2.5 (3) MG/3ML IN SOLN
3.0000 mL | Freq: Once | RESPIRATORY_TRACT | Status: AC
  Administered 2022-11-21: 3 mL via RESPIRATORY_TRACT

## 2022-11-21 MED ORDER — BENZONATATE 100 MG PO CAPS: 100.0000 mg | ORAL_CAPSULE | Freq: Three times a day (TID) | ORAL | 0 refills | Status: DC | PRN

## 2022-11-21 NOTE — Discharge Instructions (Signed)
Please start using the Symbicort inhaler twice daily. Use every morning and each night.  Prednisone taken once daily for 5 days  The tessalon cough pills can be taken 3x daily. If this medication makes you drowsy, take only one pill before bed.  Contact primary care provider to move appointment sooner. I have also placed a referral to a lung specialist. They should call you to make an appointment.

## 2022-11-21 NOTE — ED Provider Notes (Signed)
[facility]    CSN: [id number] Arrival date & time: 11/21/22  1015      History   Chief Complaint Chief Complaint  Patient presents with   Cough    HPI [name] is a 57 y.o. female.  Several week history of cough, nasal congestion, shortness of breath and wheezing. Continually worsening. Estimates symptoms for about 4 weeks. Gets winded with short walking. Cough is dry but she feels there is mucous stuck in the chest  Sick in July, xray with chronic bronchitis, feels she never fully recovered since then. She does have COPD. Does not have a pulmonologist. 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   Bilateral carpal tunnel syndrome 06/24/2017   Primary osteoarthritis of first carpometacarpal joint of right hand 06/24/2017   Chronic low back pain 06/23/2012   COPD (chronic obstructive pulmonary disease) (HCC) 11/19/2010   Drug addiction in remission (HCC) 11/19/2010   Common migraine 03/23/2010   GERD (gastroesophageal reflux disease) 03/12/2010   Obesity (BMI 35.0-39.9 without comorbidity) 03/12/2010   Tobacco use disorder 12/11/2009   Major depression, recurrent (HCC) 03/03/2009    Past Surgical History:  Procedure Laterality Date   ABDOMINAL HYSTERECTOMY     CARPAL TUNNEL RELEASE Bilateral    11/2019   CERVICAL CONE BIOPSY  1996   Laser   CHOLECYSTECTOMY N/A 04/26/2022   Procedure: LAPAROSCOPIC CHOLECYSTECTOMY WITH ICG DYE;  Surgeon: Emelia Loron, MD;  Location: [facility];  Service: General;  Laterality: N/A;  GEN AND TAP BLOCK   COLONOSCOPY     LASER ABLATION OF THE CERVIX     MYRINGOTOMY     Bilateral, age 41 and 9   TONSILLECTOMY AND ADENOIDECTOMY     TOTAL VAGINAL HYSTERECTOMY  07/2007   TVH-menorrhagia, dysmenorrhea, still have fallopians tubes and ovaries   TUBAL LIGATION     Tubal Ligation/Steriization   VULVECTOMY N/A 09/25/2017   Procedure: PARTIAL VULVECTOMY;  Surgeon: Shonna Chock, MD;  Location: [facility];  Service: Gynecology;  Laterality: N/A;  WISDOM TOOTH EXTRACTION      OB History   No obstetric history on file.      Home Medications    Prior to Admission medications   Medication Sig Start Date End Date Taking? Authorizing Provider  benzonatate (TESSALON) 100 MG capsule Take 1 capsule (100 mg total) by mouth 3 (three) times daily as needed for cough. 11/21/22  Yes Annalee Meyerhoff, Lurena Joiner, PA-C  budesonide-formoterol (SYMBICORT) 80-4.5 MCG/ACT inhaler Inhale 2 puffs into the lungs in the morning and at bedtime. 11/21/22  Yes Izaias Krupka, Lurena Joiner, PA-C  predniSONE (STERAPRED UNI-PAK 21 TAB) 10 MG (21) TBPK tablet Take by mouth  daily. Please take as directed 11/21/22  Yes Roselin Wiemann, Lurena Joiner, PA-C  acetaminophen (TYLENOL) 500 MG tablet Take 1,000 mg by mouth every 6 (six) hours as needed for moderate pain.    [provider]  albuterol (VENTOLIN HFA) 108 (90 Base) MCG/ACT inhaler Inhale 2 puffs into the lungs every 4 (four) hours as needed for wheezing or shortness of breath. 07/11/22   Rodriguez-Southworth, Nettie Elm, PA-C  DULoxetine (CYMBALTA) 30 MG capsule Take 1 capsule (30 mg total) by mouth daily for 14 days. 07/26/22 08/09/22  Karie Georges, MD  DULoxetine (CYMBALTA) 60 MG capsule Take 1 capsule (60 mg total) by mouth daily. 08/08/22   Karie Georges, MD  estradiol (ESTRACE) 1 MG tablet Take 1 tablet (1 mg total) by mouth daily. 07/26/22   Karie Georges, MD  fluconazole (DIFLUCAN) 150 MG tablet Take 1 tablet (150 mg total) by mouth once as needed for up to 2 doses (take one pill on day 1, and the second pill 3 days later). Patient not taking: Reported on 11/21/2022 07/17/22   Genette Huertas, Lurena Joiner, PA-C  meclizine (ANTIVERT) 12.5 MG tablet Take 12.5 mg by mouth 3 (three) times daily as needed for dizziness.    [provider]  omeprazole (PRILOSEC) 40 MG capsule Take 1 capsule (40 mg total) by mouth daily. 07/26/22   Karie Georges, MD  oxybutynin (DITROPAN-XL) 5 MG 24 hr tablet Take 1 tablet (5 mg total) by mouth at bedtime. 07/26/22   Karie Georges, MD  valsartan (DIOVAN) 80 MG tablet Take 1 tablet (80 mg total) by mouth daily. 10/07/22   Nahser, Deloris Ping, MD    Family History Family History  Problem Relation Age of Onset   Heart failure Mother    Diabetes Mother    Cancer Father    COPD Maternal Grandmother    Emphysema Maternal Grandmother    Arthritis Maternal Grandmother    Lung cancer Maternal Grandfather    Breast cancer Paternal Grandmother    Tuberculosis Paternal Grandmother    Stroke Paternal Grandmother    Heart failure Paternal Grandfather    High blood pressure Paternal  Grandfather    Colon cancer Neg Hx    Colon polyps Neg Hx    Esophageal cancer Neg Hx    Rectal cancer Neg Hx    Stomach cancer Neg Hx     Social History Social History   Tobacco Use   Smoking status: Former    Current packs/day: 0.00    Average packs/day: 0.5 packs/day for 40.0 years (20.0 ttl pk-yrs)    Types: Cigarettes    Start date: 04/09/1981    Quit date: 04/09/2021    Years since quitting: 1.6   Smokeless tobacco: Never   Tobacco comments:    Patient smokes less than 1/2 pack of cigarettes a day-quit 1 week ago  Vaping Use  Vaping status: Never Used  Substance Use Topics   Alcohol use: Yes    Comment: occasionally   Drug use: Not Currently    Types: Marijuana, "Crack" cocaine    Comment: 20 years clean per pt     Allergies   Ketorolac, Codeine, Metronidazole, and Latex   Review of Systems Review of Systems  Respiratory:  Positive for cough.    As per HPI  Physical Exam Triage Vital Signs ED Triage Vitals  Encounter Vitals Group     BP 11/21/22 1115 (!) 173/103     Systolic BP Percentile --      Diastolic BP Percentile --      Pulse Rate 11/21/22 1115 82     Resp 11/21/22 1115 17     Temp 11/21/22 1115 98.4 F (36.9 C)     Temp Source 11/21/22 1115 Oral     SpO2 11/21/22 1115 92 %     Weight --      Height --      Head Circumference --      Peak Flow --      Pain Score 11/21/22 1112 3     Pain Loc --      Pain Education --      Exclude from Growth Chart --    No data found.  Updated Vital Signs BP (!) 157/91 (BP Location: Right Arm)   Pulse 82   Temp 98.4 F (36.9 C) (Oral)   Resp 17   SpO2 95%    Physical Exam Vitals and nursing note reviewed.  Constitutional:      General: She is not in acute distress.    Appearance: She is not ill-appearing.  HENT:     Nose: No congestion or rhinorrhea.     Mouth/Throat:     Mouth: Mucous membranes are moist.     Pharynx: Oropharynx is clear. No posterior oropharyngeal erythema.  Eyes:      Conjunctiva/sclera: Conjunctivae normal.  Cardiovascular:     Rate and Rhythm: Normal rate and regular rhythm.     Heart sounds: Normal heart sounds.  Pulmonary:     Effort: Pulmonary effort is normal. No respiratory distress.     Breath sounds: Wheezing present.     Comments: No respiratory distress, can speak in full sentences. Normal work of breathing. Inspirational and expirational wheezing heard throughout, with some low pitched crackling in bilateral bases Musculoskeletal:     Cervical back: Normal range of motion.     Right lower leg: No edema.     Left lower leg: No edema.  Skin:    General: Skin is warm and dry.  Neurological:     Mental Status: She is alert and oriented to person, place, and time.     UC Treatments / Results  Labs (all labs ordered are listed, but only abnormal results are displayed) Labs Reviewed - No data to display  EKG  Radiology DG Chest 2 View  Result Date: 11/21/2022 CLINICAL DATA:  Cough with shortness of breath and wheezing for 4 weeks. EXAM: CHEST - 2 VIEW COMPARISON:  Radiographs 07/17/2022 and 07/11/2022. FINDINGS: The heart size and mediastinal contours are stable. Stable mild chronic central airway thickening without evidence of superimposed airspace disease, significant hyperinflation, pleural effusion or pneumothorax. The bones appear unchanged. IMPRESSION: Stable chest. No evidence of acute cardiopulmonary process. Electronically Signed   By: Carey Bullocks M.D.   On: 11/21/2022 14:00    Procedures Procedures   Medications Ordered in  UC Medications  ipratropium-albuterol (DUONEB) 0.5-2.5 (3) MG/3ML nebulizer solution 3 mL (3 mLs Nebulization Given 11/21/22 1142)    Initial Impression / Assessment and Plan / UC Course  I have reviewed the triage vital signs and the nursing notes.  Pertinent labs & imaging results that were available during my care of the patient were reviewed by me and considered in my medical decision making (see  chart for details).  DuoNeb given with improvement. Sating well on room air.  CXR unchanged from June. COPD exacerbation. She is not having productive cough. Does not meet criteria for abx at this time. Would like to start her on Symbicort twice daily Prednisone 40 mg daily x 5 Tessalon TID prn Referral placed to pulmonology. Advised contact primary care provider as well. ED precautions for any worsening of symptoms   Final Clinical Impressions(s) / UC Diagnoses   Final diagnoses:  COPD exacerbation [facility])     Discharge Instructions      Please start using the Symbicort inhaler twice daily. Use every morning and each night.  Prednisone taken once daily for 5 days  The tessalon cough pills can be taken 3x daily. If this medication makes you drowsy, take only one pill before bed.  Contact primary care provider to move appointment sooner. I have also placed a referral to a lung specialist. They should call you to make an appointment.      ED Prescriptions     Medication Sig Dispense Auth. Provider   budesonide-formoterol (SYMBICORT) 80-4.5 MCG/ACT inhaler Inhale 2 puffs into the lungs in the morning and at bedtime. 1 each Aarit Kashuba, PA-C   predniSONE (STERAPRED UNI-PAK 21 TAB) 10 MG (21) TBPK tablet Take by mouth daily. Please take as directed 21 tablet Allie Ousley, PA-C   benzonatate (TESSALON) 100 MG capsule Take 1 capsule (100 mg total) by mouth 3 (three) times daily as needed for cough. 30 capsule Anthon Harpole, Lurena Joiner, PA-C      PDMP not reviewed this encounter.   Bobi Daudelin, Lurena Joiner, [place] 11/22/22 1610

## 2022-11-21 NOTE — ED Triage Notes (Addendum)
Pt c/o cough, congestion, SOB, wheezing for 4 weeks. States she has albuterol inhaler and it does not help at all   States she was sick back in July and never fully got better

## 2022-12-03 ENCOUNTER — Ambulatory Visit: Payer: BC Managed Care – PPO | Admitting: Family Medicine

## 2022-12-03 ENCOUNTER — Encounter: Payer: Self-pay | Admitting: Family Medicine

## 2022-12-03 VITALS — BP 122/68 | HR 84 | Temp 98.6°F | Ht 64.0 in | Wt 218.1 lb

## 2022-12-03 DIAGNOSIS — R400 Somnolence: Secondary | ICD-10-CM

## 2022-12-03 DIAGNOSIS — F331 Major depressive disorder, recurrent, moderate: Secondary | ICD-10-CM | POA: Diagnosis not present

## 2022-12-03 DIAGNOSIS — M545 Low back pain, unspecified: Secondary | ICD-10-CM

## 2022-12-03 DIAGNOSIS — R7303 Prediabetes: Secondary | ICD-10-CM

## 2022-12-03 DIAGNOSIS — N3941 Urge incontinence: Secondary | ICD-10-CM | POA: Diagnosis not present

## 2022-12-03 DIAGNOSIS — Z1231 Encounter for screening mammogram for malignant neoplasm of breast: Secondary | ICD-10-CM

## 2022-12-03 DIAGNOSIS — I10 Essential (primary) hypertension: Secondary | ICD-10-CM

## 2022-12-03 DIAGNOSIS — J42 Unspecified chronic bronchitis: Secondary | ICD-10-CM

## 2022-12-03 DIAGNOSIS — G8929 Other chronic pain: Secondary | ICD-10-CM

## 2022-12-03 DIAGNOSIS — Z23 Encounter for immunization: Secondary | ICD-10-CM | POA: Diagnosis not present

## 2022-12-03 MED ORDER — METFORMIN HCL 500 MG PO TABS: 500.0000 mg | ORAL_TABLET | Freq: Two times a day (BID) | ORAL | 1 refills | Status: DC

## 2022-12-03 MED ORDER — OXYBUTYNIN CHLORIDE ER 10 MG PO TB24: 10.0000 mg | ORAL_TABLET | Freq: Every day | ORAL | 1 refills | Status: DC

## 2022-12-03 MED ORDER — DULOXETINE HCL 30 MG PO CPEP: 90.0000 mg | ORAL_CAPSULE | Freq: Every day | ORAL | 1 refills | Status: DC

## 2022-12-03 MED ORDER — DICLOFENAC SODIUM 75 MG PO TBEC: 75.0000 mg | DELAYED_RELEASE_TABLET | Freq: Two times a day (BID) | ORAL | 2 refills | Status: DC

## 2022-12-03 MED ORDER — ALBUTEROL SULFATE (2.5 MG/3ML) 0.083% IN NEBU: 2.5000 mg | INHALATION_SOLUTION | Freq: Four times a day (QID) | RESPIRATORY_TRACT | 1 refills | Status: DC | PRN

## 2022-12-03 MED ORDER — VALSARTAN 80 MG PO TABS: 80.0000 mg | ORAL_TABLET | Freq: Every day | ORAL | 1 refills | Status: DC

## 2022-12-03 NOTE — Assessment & Plan Note (Signed)
Co morbid condition to her obesity, she is also on many medications that can cause weight gain. We had a long talk about starting metformin and she is agreeable. Will see her back in 3 months. Handouts on the low carb diet were given to patient.

## 2022-12-03 NOTE — Progress Notes (Signed)
Established Patient Office Visit  Subjective   Patient ID: [name], female    DOB: 20-Mar-1965  Age: 57 y.o. MRN: [id number]  Chief Complaint  Patient presents with   Medical Management of Chronic Issues   Results    Patient states she had a titanium clip inserted in the right breast by [facility] after previous abnormal mammogram, did not return for repeat testing as advised and now needs new order    Pt is here for follow up today.  Depression-- pt states her symptoms are slightly better. She is still struggling with a total lack of energy, states that she has the desire to do activities, however she just doesn't "have the energy" to do anything. States that she "tosses and turns" at night and often has a lot of trouble sleeping, tossing and turning a lot at night, not sure if she is snoring or not. Has excessive daytime somnolence.  Chronic bronchitis- pt reports she had to severe episodes of bronchitis, one in July and one just recently. State sthat she was told she needed a nebulizer at home in case of a flare up. She was also placed on symbicort and reports good improvement of her respiratory symptoms with this medication.   Chronic back and neck pain-- pt states that anytime she is on steroids for her breathing she has significant improvement in her chronic neck and back pain, states that she knows that she cannot take steroids indefinitely but is asking for something to help her with her chronic back pain symptoms. We discussed starting daily NSAIDS and she is agreeable.   Obesity-- pt states that she wants to lose weight, reports that she is eating healthy foods, monitoring her portion size and she is walking daily but she seems to be gaining rather than losing. I have had an extensive 30 minute conversation today with the patient about healthy eating habits, exercise, calorie and carb goals for sustainable and successful weight loss. I gave the patient caloric and protein daily  intake values as well as described the importance of increasing fiber and water intake. I discussed weight loss medications that could be used in the treatment of this patient. Handouts on low carb eating were given to the patient.    Urge incontinence-- pt reports that she did get dry mouth with the oxybutinin, however this got better, states that her symptoms improved initially but she feels like the medication is no longer working for her. We discussed increasing the dose and she is agreeable.   HTN -- BP in office performed and is well controlled. She  reports no side effects to the medications, no chest pain, SOB, dizziness or headaches. She has a BP cuff at home and is checking BP regularly, reports they are in the normal range.        Current Outpatient Medications  Medication Instructions   acetaminophen (TYLENOL) 1,000 mg, Oral, Every 6 hours PRN   albuterol (PROVENTIL) 2.5 mg, Nebulization, Every 6 hours PRN   albuterol (VENTOLIN HFA) 108 (90 Base) MCG/ACT inhaler 2 puffs, Inhalation, Every 4 hours PRN   benzonatate (TESSALON) 100 mg, Oral, 3 times daily PRN   budesonide-formoterol (SYMBICORT) 80-4.5 MCG/ACT inhaler 2 puffs, Inhalation, 2 times daily   diclofenac (VOLTAREN) 75 mg, Oral, 2 times daily   DULoxetine (CYMBALTA) 90 mg, Oral, Daily   estradiol (ESTRACE) 1 mg, Oral, Daily   meclizine (ANTIVERT) 12.5 mg, Oral, 3 times daily PRN   metFORMIN (GLUCOPHAGE) 500 mg, Oral,  2 times daily with meals   omeprazole (PRILOSEC) 40 mg, Oral, Daily   oxybutynin (DITROPAN-XL) 10 mg, Oral, Daily at bedtime   valsartan (DIOVAN) 80 mg, Oral, Daily    Patient Active Problem List   Diagnosis Date Noted   Prediabetes 12/03/2022   Primary hypertension 07/29/2022   Urge incontinence 07/29/2022   Abdominal pain, epigastric 05/25/2021   Nausea and vomiting 05/25/2021   Loose stools 05/25/2021   Menopausal symptoms 07/05/2020   Facet arthritis of cervical region 09/09/2019   Tubular  adenoma of colon 06/16/2019   Urinary incontinence 04/09/2019   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     Objective:     BP 122/68 (BP Location: Right Arm, Patient Position: Sitting, Cuff Size: Large)   Pulse 84   Temp 98.6 F (37 C) (Oral)   Ht 5\' 4"  (1.626 m)   Wt 218 lb 1.6 oz (98.9 kg)   SpO2 96%   BMI 37.44 kg/m    Physical Exam Vitals reviewed.  Constitutional:      Appearance: Normal appearance. She is well-groomed. She is obese.  Eyes:     Conjunctiva/sclera: Conjunctivae normal.  Neck:     Thyroid: No thyromegaly.  Cardiovascular:     Rate and Rhythm: Normal rate and regular rhythm.     Pulses: Normal pulses.     Heart sounds: S1 normal and S2 normal.  Pulmonary:     Effort: Pulmonary effort is normal.     Breath sounds: Normal breath sounds and air entry.  Abdominal:     General: Bowel sounds are normal.  Musculoskeletal:     Right lower leg: No edema.     Left lower leg: No edema.  Neurological:     Mental Status: She is alert and oriented to person, place, and time. Mental status is at baseline.     Gait: Gait is intact.  Psychiatric:        Mood and Affect: Mood and affect normal.        Speech: Speech normal.        Behavior: Behavior normal.        Judgment: Judgment normal.      No results found for any visits on 12/03/22.    The 10-year ASCVD risk score (Arnett DK, et al., 2019) is:  7.5%    Assessment & Plan:  Breast cancer screening by mammogram -     Ambulatory referral to Interventional Radiology  Urge incontinence Assessment & Plan: Initially had improvement of her incontinence, will increase to 10 mg daily  Orders: -     oxyBUTYnin Chloride ER; Take 1 tablet (10 mg total) by mouth at bedtime.  Dispense: 90 tablet; Refill: 1  Moderate episode of recurrent major depressive disorder (HCC) Assessment & Plan: Sx better but still having extremely low energy, motivation, is constantly tired, will try increasing to 90 mg daily, I think she needs to be ruled out for OSA given the sleep disturbances  and daytime somnolence. Orders placed.   Orders: -     DULoxetine HCl; Take 3 capsules (90 mg total) by mouth daily.  Dispense: 270 capsule; Refill: 1  Primary hypertension Assessment & Plan: Current hypertension medications:       Sig   valsartan (DIOVAN) 80 MG tablet Take 1 tablet (80 mg total) by mouth daily.      BP is chronic, stable, well controlled on the above medication, will continue as prescribed  Orders: -     Valsartan; Take 1 tablet (80 mg total) by mouth daily.  Dispense: 90 tablet; Refill: 1  Daytime somnolence -     Ambulatory referral to Sleep Studies  Chronic bronchitis, unspecified chronic bronchitis type (HCC) Assessment & Plan: Lungs are clear on exam today, will add nebulizer and albuterol treatments for any flare ups she might have.   Orders: -     For home use only DME Nebulizer machine -     Albuterol Sulfate; Take 3 mLs (2.5 mg total) by nebulization every 6 (six) hours as needed for wheezing or shortness of breath.  Dispense: 150 mL; Refill: 1  Chronic midline low back pain without sciatica Assessment & Plan: Adding diclofenac 75 mg BID to pain management  Orders: -     Diclofenac Sodium; Take 1 tablet (75 mg total) by mouth 2 (two) times daily.  Dispense: 60 tablet; Refill: 2  Prediabetes Assessment & Plan: Co morbid  condition to her obesity, she is also on many medications that can cause weight gain. We had a long talk about starting metformin and she is agreeable. Will see her back in 3 months. Handouts on the low carb diet were given to patient.   Orders: -     metFORMIN HCl; Take 1 tablet (500 mg total) by mouth 2 (two) times daily with a meal.  Dispense: 180 tablet; Refill: 1  Need for shingles vaccine -     Varicella-zoster vaccine subcutaneous -     Varicella-zoster vaccine IM  Need for influenza vaccination -     Flu vaccine trivalent PF, 6mos and older(Flulaval,Afluria,Fluarix,Fluzone)   I spent 65 minutes with patient today on multiple chronic medical problems, I reviewed her health maintenance, reviewed urgent care notes, labs, discussed medications, etc.   Return in about 3 months (around 03/05/2023).    Karie Georges, MD

## 2022-12-03 NOTE — Assessment & Plan Note (Signed)
Initially had improvement of her incontinence, will increase to 10 mg daily

## 2022-12-03 NOTE — Assessment & Plan Note (Signed)
Adding diclofenac 75 mg BID to pain management

## 2022-12-03 NOTE — Assessment & Plan Note (Signed)
Lungs are clear on exam today, will add nebulizer and albuterol treatments for any flare ups she might have.

## 2022-12-03 NOTE — Patient Instructions (Signed)
Limit carbs to 70 grams per day - try getting an app on your phone that will help you track your carbs- my favorite is Carb Manager

## 2022-12-03 NOTE — Assessment & Plan Note (Signed)
Sx better but still having extremely low energy, motivation, is constantly tired, will try increasing to 90 mg daily, I think she needs to be ruled out for OSA given the sleep disturbances and daytime somnolence. Orders placed.

## 2022-12-03 NOTE — Assessment & Plan Note (Signed)
Current hypertension medications:       Sig   valsartan (DIOVAN) 80 MG tablet Take 1 tablet (80 mg total) by mouth daily.      BP is chronic, stable, well controlled on the above medication, will continue as prescribed

## 2022-12-23 DIAGNOSIS — N644 Mastodynia: Secondary | ICD-10-CM | POA: Diagnosis not present

## 2022-12-23 LAB — HM MAMMOGRAPHY

## 2022-12-30 ENCOUNTER — Encounter: Payer: Self-pay | Admitting: Family Medicine

## 2023-01-03 ENCOUNTER — Ambulatory Visit: Payer: BC Managed Care – PPO | Admitting: Family Medicine

## 2023-03-04 ENCOUNTER — Ambulatory Visit (INDEPENDENT_AMBULATORY_CARE_PROVIDER_SITE_OTHER): Payer: BC Managed Care – PPO | Admitting: Family Medicine

## 2023-03-04 ENCOUNTER — Telehealth: Payer: Self-pay

## 2023-03-04 VITALS — BP 160/80 | HR 85 | Temp 98.4°F | Wt 220.3 lb

## 2023-03-04 DIAGNOSIS — M545 Low back pain, unspecified: Secondary | ICD-10-CM

## 2023-03-04 DIAGNOSIS — R519 Headache, unspecified: Secondary | ICD-10-CM | POA: Diagnosis not present

## 2023-03-04 DIAGNOSIS — Z23 Encounter for immunization: Secondary | ICD-10-CM

## 2023-03-04 DIAGNOSIS — N951 Menopausal and female climacteric states: Secondary | ICD-10-CM

## 2023-03-04 DIAGNOSIS — G4733 Obstructive sleep apnea (adult) (pediatric): Secondary | ICD-10-CM | POA: Diagnosis not present

## 2023-03-04 DIAGNOSIS — I10 Essential (primary) hypertension: Secondary | ICD-10-CM

## 2023-03-04 DIAGNOSIS — R200 Anesthesia of skin: Secondary | ICD-10-CM

## 2023-03-04 DIAGNOSIS — N3941 Urge incontinence: Secondary | ICD-10-CM

## 2023-03-04 DIAGNOSIS — R29818 Other symptoms and signs involving the nervous system: Secondary | ICD-10-CM

## 2023-03-04 DIAGNOSIS — F321 Major depressive disorder, single episode, moderate: Secondary | ICD-10-CM

## 2023-03-04 DIAGNOSIS — R202 Paresthesia of skin: Secondary | ICD-10-CM

## 2023-03-04 DIAGNOSIS — G8929 Other chronic pain: Secondary | ICD-10-CM

## 2023-03-04 DIAGNOSIS — K219 Gastro-esophageal reflux disease without esophagitis: Secondary | ICD-10-CM

## 2023-03-04 MED ORDER — GEMTESA 75 MG PO TABS: 1.0000 | ORAL_TABLET | Freq: Every day | ORAL | 5 refills | Status: DC

## 2023-03-04 MED ORDER — DULOXETINE HCL 60 MG PO CPEP: 60.0000 mg | ORAL_CAPSULE | Freq: Every day | ORAL | 3 refills | Status: DC

## 2023-03-04 MED ORDER — DICLOFENAC SODIUM 75 MG PO TBEC: 75.0000 mg | DELAYED_RELEASE_TABLET | Freq: Two times a day (BID) | ORAL | 2 refills | Status: DC

## 2023-03-04 MED ORDER — ESTRADIOL 1 MG PO TABS: 1.0000 mg | ORAL_TABLET | Freq: Every day | ORAL | 1 refills | Status: DC

## 2023-03-04 MED ORDER — VALSARTAN 160 MG PO TABS: 160.0000 mg | ORAL_TABLET | Freq: Every day | ORAL | 1 refills | Status: DC

## 2023-03-04 MED ORDER — OMEPRAZOLE 40 MG PO CPDR: 40.0000 mg | DELAYED_RELEASE_CAPSULE | Freq: Every day | ORAL | 1 refills | Status: DC

## 2023-03-04 MED ORDER — TOPIRAMATE 50 MG PO TABS: ORAL_TABLET | ORAL | 2 refills | Status: DC

## 2023-03-04 MED ORDER — SHINGRIX 50 MCG/0.5ML IM SUSR: 0.5000 mL | Freq: Once | INTRAMUSCULAR | 0 refills | Status: AC

## 2023-03-04 MED ORDER — BUPROPION HCL ER (XL) 150 MG PO TB24: 150.0000 mg | ORAL_TABLET | Freq: Every day | ORAL | 3 refills | Status: DC

## 2023-03-04 NOTE — Progress Notes (Unsigned)
 Established Patient Office Visit  Subjective   Patient ID: [name], female    DOB: 1965/11/10  Age: 58 y.o. MRN: [id number]  Chief Complaint  Patient presents with   Medical Management of Chronic Issues   Headache    Patient complains of severe anterior head/neck pain for some time, complained of blurred vision, nausea and headaches as she suspected this was due to HTN    Grief    Patient states her mother passe away suddenly December 2024    Pt is her for 3 month follow up today on multiple chronic medical problems.  Chronic neck/ headache pain-- pt states that today she is feeling ok, not in a lot of pain today. BP remains elevated, states it is also high at home.  HTN  Urinary incontinence-- oxybutinin is not working at all for her urgency symptoms, states that she is still unable to hold her urine. States that she will squeeze but it is still leaking out-- states the urologist gave her sample of gemtesa which worked [employer] but it was not covered on her insurance. States she was told she needed to fail oxybutinin first which she has at this point.     Current Outpatient Medications  Medication Instructions   acetaminophen (TYLENOL) 1,000 mg, Every 6 hours PRN   albuterol (PROVENTIL) 2.5 mg, Nebulization, Every 6 hours PRN   albuterol (VENTOLIN HFA) 108 (90 Base) MCG/ACT inhaler 2 puffs, Inhalation, Every 4 hours PRN   benzonatate (TESSALON) 100 mg, Oral, 3 times daily PRN   budesonide-formoterol (SYMBICORT) 80-4.5 MCG/ACT inhaler 2 puffs, Inhalation, 2 times daily   buPROPion (WELLBUTRIN XL) 150 mg, Oral, Daily   diclofenac (VOLTAREN) 75 mg, Oral, 2 times daily   DULoxetine (CYMBALTA) 60 mg, Oral, Daily   estradiol (ESTRACE) 1 mg, Oral, Daily   Gemtesa 75 mg, Oral, Daily   meclizine (ANTIVERT) 12.5 mg, 3 times daily PRN   omeprazole (PRILOSEC) 40 mg, Oral, Daily   oxybutynin (DITROPAN-XL) 10 mg, Oral, Daily at bedtime   SHINGRIX injection 0.5 mLs,  Intramuscular,  Once   topiramate (TOPAMAX) 50 MG tablet Take 0.5 tablets (25 mg total) by mouth at bedtime for 7 days, THEN 1 tablet (50 mg total) at bedtime for 7 days, THEN 1 tablet (50 mg total) 2 (two) times daily for 7 days.   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     Objective:     BP (!) 160/80   Pulse 85   Temp  98.4 F (36.9 C) (Oral)   Wt 220 lb 4.8 oz (99.9 kg)   SpO2 96%   BMI 37.81 kg/m  {Vitals History (Optional):23777}  Physical Exam   No results found for any visits on 03/04/23.  {Labs (Optional):23779}  The 10-year ASCVD risk score (Arnett DK, et al., 2019) is: 5.3%    Assessment & Plan:  Primary hypertension -     Valsartan; Take 1 tablet (160 mg total) by mouth daily.  Dispense: 90 tablet; Refill: 1  Obstructive sleep apnea  Numbness and tingling sensation of skin -     Vitamin B12; Future  Headache with neurologic  deficit -     CT HEAD WO CONTRAST ( ); Future -     Topiramate; Take 0.5 tablets (25 mg total) by mouth at bedtime for 7 days, THEN 1 tablet (50 mg total) at bedtime for 7 days, THEN 1 tablet (50 mg total) 2 (two) times daily for 7 days.  Dispense: 60 tablet; Refill: 2  Depression, major, single episode, moderate (HCC) -     DULoxetine HCl; Take 1 capsule (60 mg total) by mouth daily.  Dispense: 90 capsule; Refill: 3 -     buPROPion HCl ER (XL); Take 1 tablet (150 mg total) by mouth daily.  Dispense: 30 tablet; Refill: 3  Urge incontinence -     Gemtesa; Take 1 tablet (75 mg total) by mouth daily.  Dispense: 30 tablet; Refill: 5  Chronic midline low back pain without sciatica -     Diclofenac Sodium; Take 1 tablet (75 mg total) by mouth 2 (two) times daily.  Dispense: 60 tablet; Refill: 2  Menopausal symptoms -     Estradiol; Take 1 tablet (1 mg total) by mouth daily.  Dispense: 90 tablet; Refill: 1  Gastroesophageal reflux disease without esophagitis -     Omeprazole; Take 1 capsule (40 mg total) by mouth daily.  Dispense: 90 capsule; Refill: 1  Immunization due -     Shingrix; Inject 0.5 mLs into the muscle once for 1 dose.  Dispense: 0.5 mL; Refill: 0     Return in about 2 months (around 05/02/2023) for follow up on headaches and depression.    Karie Georges, MD

## 2023-03-04 NOTE — Telephone Encounter (Signed)
 Copied from CRM [id number]. Topic: General - Running Late >> Mar 04, 2023  3:45 PM Isabelle Course C wrote: Patient/patient representative is calling because they are running late for an appointment.

## 2023-03-04 NOTE — Telephone Encounter (Signed)
 Patient has been checked in for the appt today.

## 2023-03-05 DIAGNOSIS — F321 Major depressive disorder, single episode, moderate: Secondary | ICD-10-CM | POA: Insufficient documentation

## 2023-03-05 DIAGNOSIS — R29818 Other symptoms and signs involving the nervous system: Secondary | ICD-10-CM | POA: Insufficient documentation

## 2023-03-05 NOTE — Assessment & Plan Note (Signed)
 Chronic, uncontrolled, will increase vlasartan to 160 mg daily and see her back in 2 months for followu p

## 2023-03-05 NOTE — Assessment & Plan Note (Signed)
 Chronic, occipital, with neurologic deficit, will order CT Head and start on topiramate and titrate up slowly, RTC in 2 months, if not improved may need referral to neurology

## 2023-03-05 NOTE — Assessment & Plan Note (Signed)
 Will reduce cymbalta back down to 60 mg daily and add wellbutrin 150 mg daily to her regimen.

## 2023-03-05 NOTE — Assessment & Plan Note (Signed)
 Stable on estradiol, will continue medication

## 2023-03-05 NOTE — Assessment & Plan Note (Signed)
 Patient has failed oxybutinin, will attempt to get her the [facility] and I gave her the website in order to download the coupon to help with the cost of the medication. Refills sent to pharmacy

## 2023-03-05 NOTE — Assessment & Plan Note (Signed)
 Sending in orders for CPAP machine

## 2023-03-05 NOTE — Assessment & Plan Note (Signed)
 Pt reports today that the diclofenac is working well for her back pain but not the headaches, will continue as prescribed

## 2023-03-06 ENCOUNTER — Telehealth: Payer: Self-pay | Admitting: Pharmacy Technician

## 2023-03-06 ENCOUNTER — Other Ambulatory Visit (HOSPITAL_COMMUNITY): Payer: Self-pay

## 2023-03-06 ENCOUNTER — Encounter: Payer: Self-pay | Admitting: Family Medicine

## 2023-03-06 NOTE — Telephone Encounter (Signed)
 Pharmacy Patient Advocate Encounter  Received notification from  [employer]  that Prior Authorization for Gemtesa 75MG  tablets has been APPROVED from 03/05/2023 to 03/04/2024.  Key: [id number] PA #/Case ID/Reference #: [id number]

## 2023-03-10 MED ORDER — BENZONATATE 100 MG PO CAPS: 100.0000 mg | ORAL_CAPSULE | Freq: Three times a day (TID) | ORAL | 0 refills | Status: DC | PRN

## 2023-03-13 ENCOUNTER — Encounter: Payer: Self-pay | Admitting: Family Medicine

## 2023-03-14 ENCOUNTER — Other Ambulatory Visit: Payer: BC Managed Care – PPO

## 2023-03-14 ENCOUNTER — Ambulatory Visit
Admission: RE | Admit: 2023-03-14 | Discharge: 2023-03-14 | Disposition: A | Discharge status: Home / Self Care | Payer: BC Managed Care – PPO | Source: Ambulatory Visit | Attending: Family Medicine | Admitting: Family Medicine

## 2023-03-14 DIAGNOSIS — R29818 Other symptoms and signs involving the nervous system: Secondary | ICD-10-CM

## 2023-03-14 DIAGNOSIS — R200 Anesthesia of skin: Secondary | ICD-10-CM | POA: Diagnosis not present

## 2023-03-14 DIAGNOSIS — H538 Other visual disturbances: Secondary | ICD-10-CM | POA: Diagnosis not present

## 2023-03-14 DIAGNOSIS — R519 Headache, unspecified: Secondary | ICD-10-CM | POA: Diagnosis not present

## 2023-03-15 LAB — VITAMIN B12: Vitamin B-12: 353 pg/mL (ref 200–1100)

## 2023-03-16 ENCOUNTER — Encounter: Payer: Self-pay | Admitting: Family Medicine

## 2023-03-19 ENCOUNTER — Telehealth: Admitting: Physician Assistant

## 2023-03-19 ENCOUNTER — Encounter: Payer: Self-pay | Admitting: Family Medicine

## 2023-03-19 DIAGNOSIS — A084 Viral intestinal infection, unspecified: Secondary | ICD-10-CM

## 2023-03-19 DIAGNOSIS — R42 Dizziness and giddiness: Secondary | ICD-10-CM

## 2023-03-19 DIAGNOSIS — A0811 Acute gastroenteropathy due to Norwalk agent: Secondary | ICD-10-CM | POA: Diagnosis not present

## 2023-03-19 MED ORDER — DICYCLOMINE HCL 10 MG PO CAPS: 10.0000 mg | ORAL_CAPSULE | Freq: Three times a day (TID) | ORAL | 0 refills | Status: DC

## 2023-03-19 MED ORDER — ONDANSETRON HCL 4 MG PO TABS: 4.0000 mg | ORAL_TABLET | Freq: Three times a day (TID) | ORAL | 0 refills | Status: DC | PRN

## 2023-03-19 MED ORDER — ONDANSETRON 4 MG PO TBDP: 4.0000 mg | ORAL_TABLET | Freq: Three times a day (TID) | ORAL | 0 refills | Status: DC | PRN

## 2023-03-19 NOTE — Patient Instructions (Signed)
 [name], thank you for joining [name], PA-C for today's virtual visit.  While this provider is not your primary care provider (PCP), if your PCP is located in our provider database this encounter information will be shared with them immediately following your visit.   A [facility] MyChart account gives you access to today's visit and all your visits, tests, and labs performed at [facility] " click here if you don't have a [facility] MyChart account or go to [email or web address]  Consent: (Patient) [name] provided verbal consent for this virtual visit at the beginning of the encounter.  Current Medications:  Current Outpatient Medications:    dicyclomine (BENTYL) 10 MG capsule, Take 1 capsule (10 mg total) by mouth 4 (four) times daily -  before meals and at bedtime., Disp: 40 capsule, Rfl: 0   ondansetron (ZOFRAN-ODT) 4 MG disintegrating tablet, Take 1-2 tablets (4-8 mg total) by mouth every 8 (eight) hours as needed., Disp: 20 tablet, Rfl: 0   acetaminophen (TYLENOL) 500 MG tablet, Take 1,000 mg by mouth every 6 (six) hours as needed for moderate pain., Disp: , Rfl:    albuterol (PROVENTIL) (2.5 MG/3ML) 0.083% nebulizer solution, Take 3 mLs (2.5 mg total) by nebulization every 6 (six) hours as needed for wheezing or shortness of breath., Disp: 150 mL, Rfl: 1   albuterol (VENTOLIN HFA) 108 (90 Base) MCG/ACT inhaler, Inhale 2 puffs into the lungs every 4 (four) hours as needed for wheezing or shortness of breath., Disp: 1 each, Rfl: 0   benzonatate (TESSALON) 100 MG capsule, Take 1 capsule (100 mg total) by mouth 3 (three) times daily as needed for cough., Disp: 30 capsule, Rfl: 0   budesonide-formoterol (SYMBICORT) 80-4.5 MCG/ACT inhaler, Inhale 2 puffs into the lungs in the morning and at bedtime., Disp: 1 each, Rfl: 1   buPROPion (WELLBUTRIN XL) 150 MG 24 hr tablet, Take 1 tablet (150 mg total) by mouth daily., Disp: 30 tablet, Rfl: 3    diclofenac (VOLTAREN) 75 MG EC tablet, Take 1 tablet (75 mg total) by mouth 2 (two) times daily., Disp: 60 tablet, Rfl: 2   DULoxetine (CYMBALTA) 60 MG capsule, Take 1 capsule (60 mg total) by mouth daily., Disp: 90 capsule, Rfl: 3   estradiol (ESTRACE) 1 MG tablet, Take 1 tablet (1 mg total) by mouth daily., Disp: 90 tablet, Rfl: 1   meclizine (ANTIVERT) 12.5 MG tablet, Take 12.5 mg by mouth 3 (three) times daily as needed for dizziness., Disp: , Rfl:    omeprazole (PRILOSEC) 40 MG capsule, Take 1 capsule (40 mg total) by mouth daily., Disp: 90 capsule, Rfl: 1   oxybutynin (DITROPAN-XL) 10 MG 24 hr tablet, Take 1 tablet (10 mg total) by mouth at bedtime., Disp: 90 tablet, Rfl: 1   topiramate (TOPAMAX) 50 MG tablet, Take 0.5 tablets (25 mg total) by mouth at bedtime for 7 days, THEN 1 tablet (50 mg total) at bedtime for 7 days, THEN 1 tablet (50 mg total) 2 (two) times daily for 7 days., Disp: 60 tablet, Rfl: 2   valsartan (DIOVAN) 160 MG tablet, Take 1 tablet (160 mg total) by mouth daily., Disp: 90 tablet, Rfl: 1   Vibegron (GEMTESA) 75 MG TABS, Take 1 tablet (75 mg total) by mouth daily., Disp: 30 tablet, Rfl: 5   Medications ordered in this encounter:  Meds ordered this encounter  Medications   ondansetron (ZOFRAN-ODT) 4 MG disintegrating tablet    Sig: Take 1-2 tablets (4-8 mg total)  by mouth every 8 (eight) hours as needed.    Dispense:  20 tablet    Refill:  0    Supervising Provider:   Merrilee Jansky [[id number]]   dicyclomine (BENTYL) 10 MG capsule    Sig: Take 1 capsule (10 mg total) by mouth 4 (four) times daily -  before meals and at bedtime.    Dispense:  40 capsule    Refill:  0    Supervising Provider:   Merrilee Jansky [[id number]]     *If you need refills on other medications prior to your next appointment, please contact your pharmacy*  Follow-Up: Call back or seek an in-person evaluation if the symptoms worsen or if the condition fails to improve as anticipated.  [facility] [phone]  Other Instructions  For your symptoms of diarrhea you may take Imodium 2 mg tablets that are over the counter at your local pharmacy. Take two tablet now and then one after each loose stool up to 6 a day.   Norovirus Infection Norovirus infection causes inflammation in the stomach and intestines (gastroenteritis) and food poisoning. It is caused by exposure to a virus from a group of similar viruses called noroviruses. Norovirus spreads very easily from person to person (is very contagious). It often occurs in places where people are in close contact, such as schools, nursing homes, restaurants, and cruise ships. You can get it from food, water, surfaces, or other people who have the virus. Norovirus is also found in the stool (feces) or vomit of infected people. You can spread the infection as soon as you feel sick, and you may continue to be contagious after you recover. What are the causes? This condition is caused by contact with norovirus. You can catch norovirus if you: Eat or drink something that is contaminated with norovirus. Touch surfaces or objects that are contaminated with norovirus and then put your hand in or by your mouth or nose. Have direct contact with an infected person who may or may not still have symptoms. Share food, drink, or utensils with someone who is contagious with norovirus. What are the signs or symptoms? Symptoms usually begin within 12 hours to 2 days after you become infected. Most norovirus symptoms affect the digestive system.Symptoms may include: Nausea, vomiting, and diarrhea. Stomach cramps. Fever. Chills. Headache. Muscle aches and tiredness. How is this diagnosed? This condition may be diagnosed based on: Your symptoms. A physical exam. A stool test. How is this treated? There is no specific treatment for norovirus. Most people get better without treatment in about 2 days. Young children, the elderly, and  people who are already sick may take up to 6 days to recover. Follow these instructions at home:  Eating and drinking  Drink plenty of water to replace fluids that are lost through diarrhea and vomiting. This prevents dehydration. Drink enough fluid to keep your urine pale yellow. Drink clear fluids in small amounts as you are able. Clear fluids include water, ice chips, fruit juice with water added (diluted fruit juice), and low-calorie sports drinks. Avoid fluids that contain a lot of sugar or caffeine, such as energy drinks, sports drinks, and soda. Avoid alcohol. If instructed by your health care provider, drink an oral rehydration solution (ORS). This is a drink that is sold at pharmacies and retail stores. An ORS contains minerals (electrolytes) that you can lose through diarrhea and vomiting. Eat bland, easy-to-digest foods in small amounts as you are able. These foods  include rice, lean meats, toast, and crackers. Avoid spicy or fatty foods. General instructions Rest at home while you recover. Do not prepare food for others while you are infected. Wait at least 3 days after you recover from the illness to do this. Take over-the-counter and prescription medicines only as told by your health care provider. Wash your hands frequently with soap and water for at least 20 seconds. Alcohol-based hand sanitizer can be used in addition to soap and water, but sanitizer should not be the only cleansing method because it is not effective at removing norovirus from your hands or surfaces. Make sure that each person in your household washes his or her hands well and often. Keep all follow-up visits. This is important. How is this prevented? To help prevent the spread of norovirus: Stay at home if you are feeling sick. This will reduce the risk of spreading the virus to others. Wash your hands often with soap and water for at least 20 seconds, especially after using the toilet, helping a child use  the toilet, or changing a child's diaper. Wash fruits and vegetables thoroughly before peeling, preparing, or serving them. Throw out any food that a sick person may have touched. Disinfect contaminated surfaces immediately after someone in the household has been sick. Disinfect frequently used surfaces, such as counters, doorknobs, and faucets. Use a bleach-based household cleaner. Immediately remove and wash soiled clothes or sheets. Contact a health care provider if: You have vomiting, diarrhea, or stomach pain that gets worse. You have symptoms that do not go away after 3-6 days. You have a fever. You cannot drink without vomiting. You feel light-headed or dizzy. Your symptoms get worse. Get help right away if: You develop symptoms of dehydration that do not improve with fluid replacement, such as: Excessive sleepiness. Lack of tears. Very little urine production. Dry mouth. Muscle cramps. Weak pulse. Confusion. Summary Norovirus infection is common and often occurs in places where people are in close contact, such as schools, nursing homes, restaurants, and cruise ships. To help prevent the spread of this infection, wash hands with soap and water for at least 20 seconds before handling food or after having contact with stool or body fluids. There is no specific treatment for norovirus, but most people get better without treatment in about 2 days. People who are healthy when infected often recover sooner than those who are elderly, young, or already sick. Replace lost fluids by drinking plenty of water, or by drinking oral rehydration solution (ORS), which contains important minerals called electrolytes. This prevents dehydration. This information is not intended to replace advice given to you by your health care provider. Make sure you discuss any questions you have with your health care provider. Document Revised: 08/09/2020 Document Reviewed: 08/09/2020 Elsevier Patient Education   2024 Elsevier Inc.  Food Choices to Help Relieve Diarrhea, Adult Diarrhea can make you feel weak and cause you to become dehydrated. Dehydration is a condition in which there is not enough water or other fluids in the body. It is important to choose the right foods and drinks to: Relieve diarrhea. Replace lost fluids and nutrients. Prevent dehydration. What are tips for following this plan? Relieving diarrhea Avoid foods that make your diarrhea worse. These may include: Foods and drinks that are sweetened with high-fructose corn syrup, honey, or sweeteners such as xylitol, sorbitol, and mannitol. Check food labels for these ingredients. Fried, greasy, or spicy foods. Raw fruits and vegetables. Eat foods that are rich in probiotics.  These include foods such as yogurt and fermented milk products. Probiotics can help increase healthy bacteria in your stomach and intestines (gastrointestinal or GI tract). This may help digestion and stop diarrhea. If you have lactose intolerance, avoid dairy products. These may make your diarrhea worse. Take medicine to help stop diarrhea only as told by your health care provider. Replacing nutrients  Eat bland, easy-to-digest foods in small amounts as you are able, until your diarrhea starts to get better. These foods include bananas, applesauce, rice, toast, and crackers. Over time, add nutrient-rich foods as your body tolerates them or as told by your health care provider. These include: Well-cooked protein foods, such as eggs, lean meats like fish or chicken without skin, and tofu. Peeled, seeded, and soft-cooked fruits and vegetables. Low-fat dairy products. Whole grains. Take vitamin and mineral supplements as told by your health care provider. Preventing dehydration  Start by sipping water or a solution to prevent dehydration (oral rehydration solution, or ORS). This is a drink that helps replace fluids and minerals your body has lost. You can buy an  ORS at pharmacies and retail stores. Try to drink at least 8-10 cups (2,000-2,500 mL) of fluid each day to help replace lost fluids. If your urine is pale yellow, you are getting enough fluids. You may drink other liquids in addition to water, such as fruit juice that you have added water to (diluted fruit juice) or low-calorie sports drinks, as tolerated or as told by your health care provider. Avoid drinks with caffeine, such as coffee, tea, or soft drinks. Avoid alcohol. This information is not intended to replace advice given to you by your health care provider. Make sure you discuss any questions you have with your health care provider. Document Revised: 06/19/2021 Document Reviewed: 06/19/2021 Elsevier Patient Education  2024 Elsevier Inc.  [place] Diet A bland diet may consist of soft foods or foods that are not high in fat or are not greasy, acidic, or spicy. Avoiding certain foods may cause less irritation to your mouth, throat, stomach, or gastrointestinal tract. Avoiding certain foods may make you feel better. Everyone's tolerances are different. A bland diet should be based on what you can tolerate and what may cause discomfort. What is my plan? Your health care provider or dietitian may recommend specific changes to your diet to treat your symptoms. These changes may include: Eating small meals frequently. Cooking food until it is soft enough to chew easily. Taking the time to chew your food thoroughly, so it is easy to swallow and digest. Avoiding foods that cause you discomfort. These may include spicy food, fried food, greasy foods, hard-to-chew foods, or citrus fruits and juices. Drinking slowly. What are tips for following this plan? Reading food labels To reduce fiber intake, look for food labels that say "whole," such as whole wheat or whole grain. Shopping Avoid food items that may have nuts or seeds. Avoid vegetables that may make you gassy or have a tough texture, such as  broccoli, cauliflower, or corn. Cooking Cook foods thoroughly so they have a soft texture. Meal planning Make sure you include foods from all food groups to eat a balanced diet. Eat a variety of types of foods. Eat foods and drink beverages that do not cause you discomfort. These may include soups and broths with cooked meats, pasta, and vegetables. Lifestyle Sit up after meals, avoid tight clothing, and take time to eat and chew your food slowly. Ask your health care provider whether you should  take dietary supplements. General information Mildly season your foods. Some seasonings, such as cayenne pepper, vinegar, or hot sauce, may cause irritation. The foods, beverages, or seasonings to avoid should be based on individual tolerance. What foods should I eat? Fruits Canned or cooked fruit such as peaches, pears, or applesauce. Bananas. Vegetables Well-cooked vegetables. Canned or cooked vegetables such as carrots, green beans, beets, or spinach. Mashed or boiled potatoes. Grains  Hot cereals, such as cream of wheat and processed oatmeal. Rice. Bread, crackers, pasta, or tortillas made from refined white flour. Meats and other proteins  Eggs. Creamy peanut butter or other nut butters. Lean, well-cooked tender meats, such as beef, pork, chicken, or fish. Dairy Low-fat dairy products such as milk, cottage cheese, or yogurt. Beverages  Water. Herbal tea. Apple juice. Fats and oils Mild salad dressings. Canola or olive oil. Sweets and desserts Low-fat pudding, custard, or ice cream. Fruit gelatin. The items listed above may not be a complete list of foods and beverages you can eat. Contact a dietitian for more information. What foods should I avoid? Fruits Citrus fruits, such as oranges and grapefruit. Fruits with a stringy texture. Fruits that have lots of seeds, such as kiwi or strawberries. Dried fruits. Vegetables Raw, uncooked vegetables. Salads. Grains Whole grain breads,  muffins, and cereals. Meats and other proteins Tough, fibrous meats. Highly seasoned meat such as corned beef, smoked meats, or fish. Processed high-fat meats such as brats, hot dogs, or sausage. Dairy Full-fat dairy foods such as ice cream and cheese. Beverages Caffeinated drinks. Alcohol. Seasonings and condiments Strongly flavored seasonings or condiments. Hot sauce. Salsa. Other foods Spicy foods. Fried or greasy foods. Sour foods, such as pickled or fermented foods like sauerkraut. Foods high in fiber. The items listed above may not be a complete list of foods and beverages you should avoid. Contact a dietitian for more information. Summary A bland diet should be based on individual tolerance. It may consist of foods that are soft textured and do not have a lot of fat, fiber, acid, or seasonings. A bland diet may be recommended because avoiding certain foods, beverages, or spices may make you feel better. This information is not intended to replace advice given to you by your health care provider. Make sure you discuss any questions you have with your health care provider. Document Revised: 11/20/2020 Document Reviewed: 11/20/2020 Elsevier Patient Education  2024 Elsevier Inc.   If you have been instructed to have an in-person evaluation today at a local Urgent Care facility, please use the link below. It will take you to a list of all of our available [facility], including address, phone number and hours of operation. Please do not delay care.  [facility]  If you or a family member do not have a primary care provider, use the link below to schedule a visit and establish care. When you choose a [facility] primary care physician or advanced practice provider, you gain a long-term partner in health. Find a Primary Care Provider  Learn more about [facility] in-office and virtual care options: [facility] - Get Care Now

## 2023-03-19 NOTE — Progress Notes (Signed)
  Because of substantial symptoms causing lightheadedness/dizziness and concern for more substantial dehydration, I feel your condition warrants further evaluation and I recommend that you be seen in a face-to-face visit.   NOTE: There will be NO CHARGE for this E-Visit   If you are having a true medical emergency, please call 911.     For an urgent face to face visit, [facility] has multiple urgent care centers for your convenience.  Click the link below for the full list of locations and hours, walk-in wait times, appointment scheduling options and driving directions:  Urgent Care - [place], [place], [place], [place], [place], [place]  [place]     Your MyChart E-visit questionnaire answers were reviewed by a board certified advanced clinical practitioner to complete your personal care plan based on your specific symptoms.    Thank you for using e-Visits.

## 2023-03-19 NOTE — Addendum Note (Signed)
 Addended by: [name] on: 03/19/2023 04:16 PM   Modules accepted: Orders

## 2023-03-19 NOTE — Progress Notes (Signed)
 Virtual Visit Consent   [name], you are scheduled for a virtual visit with a [facility] provider today. Just as with appointments in the office, your consent must be obtained to participate. Your consent will be active for this visit and any virtual visit you may have with one of our providers in the next 365 days. If you have a MyChart account, a copy of this consent can be sent to you electronically.  As this is a virtual visit, video technology does not allow for your provider to perform a traditional examination. This may limit your provider's ability to fully assess your condition. If your provider identifies any concerns that need to be evaluated in person or the need to arrange testing (such as labs, EKG, etc.), we will make arrangements to do so. Although advances in technology are sophisticated, we cannot ensure that it will always work on either your end or our end. If the connection with a video visit is poor, the visit may have to be switched to a telephone visit. With either a video or telephone visit, we are not always able to ensure that we have a secure connection.  By engaging in this virtual visit, you consent to the provision of healthcare and authorize for your insurance to be billed (if applicable) for the services provided during this visit. Depending on your insurance coverage, you may receive a charge related to this service.  I need to obtain your verbal consent now. Are you willing to proceed with your visit today? LYNEE ROSENBACH has provided verbal consent on 03/19/2023 for a virtual visit (video or telephone). [name], PA-C  Date: 03/19/2023 2:43 PM   Virtual Visit via Video Note   I, [name], connected with  [name]  ([id number], 12-Dec-1965) on 03/19/23 at  2:15 PM EST by a video-enabled telemedicine application and verified that I am speaking with the correct person using two identifiers.  Location: Patient: Virtual Visit  Location Patient: Home Provider: Virtual Visit Location Provider: Home Office   I discussed the limitations of evaluation and management by telemedicine and the availability of in person appointments. The patient expressed understanding and agreed to proceed.    History of Present Illness: [name] is a 58 y.o. who identifies as a female who was assigned female at birth, and is being seen today for diarrhea and nausea, mild vomiting.  HPI: Diarrhea  This is a new problem. The current episode started in the past 7 days (Started 03/13/23 while at work; Sunday felt a little better, then symptoms worsened since that). The problem has been unchanged. The stool consistency is described as Watery. The patient states that diarrhea does not awaken her from sleep. Associated symptoms include abdominal pain, chills, headaches and vomiting (mild). Associated symptoms comments: Nausea. Exacerbated by: eating, milk. There are no known risk factors. She has tried electrolyte solution, increased fluids and anti-motility drug for the symptoms. The treatment provided no relief.     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   Bilateral carpal tunnel syndrome 06/24/2017   Primary osteoarthritis of first carpometacarpal joint of right hand 06/24/2017   Chronic low back pain 06/23/2012   Chronic bronchitis (HCC) 11/19/2010   Drug addiction  in remission (HCC) 11/19/2010   Common migraine 03/23/2010   GERD (gastroesophageal reflux disease) 03/12/2010   Obesity (BMI 35.0-39.9 without comorbidity) 03/12/2010   Tobacco use disorder 12/11/2009   Major depression, recurrent (HCC) 03/03/2009    Allergies:  Allergies  Allergen Reactions   Ketorolac Other (See Comments)    headaches   Codeine     Unable to sleep   Metronidazole Itching    gel   Latex Rash   Medications:  Current Outpatient Medications:    dicyclomine (BENTYL) 10 MG capsule, Take 1 capsule (10 mg total) by mouth 4 (four) times daily -  before meals and at bedtime., Disp: 40 capsule, Rfl: 0   ondansetron (ZOFRAN-ODT) 4 MG disintegrating tablet, Take 1-2 tablets (4-8 mg total) by mouth every 8 (eight) hours as needed., Disp: 20 tablet, Rfl: 0   acetaminophen (TYLENOL) 500 MG tablet, Take 1,000 mg by mouth every 6 (six) hours as needed for moderate pain., Disp: , Rfl:    albuterol (PROVENTIL) (2.5 MG/3ML) 0.083% nebulizer solution, Take 3 mLs (2.5 mg total) by nebulization every 6 (six) hours as needed for wheezing or shortness of breath., Disp: 150 mL, Rfl: 1   albuterol (VENTOLIN HFA) 108 (90 Base) MCG/ACT inhaler, Inhale 2 puffs into the lungs every 4 (four) hours as needed for wheezing or shortness of breath., Disp: 1 each, Rfl: 0   benzonatate (TESSALON) 100 MG capsule, Take 1 capsule (100 mg total) by mouth 3 (three) times daily as needed for cough., Disp: 30 capsule, Rfl: 0   budesonide-formoterol (SYMBICORT) 80-4.5 MCG/ACT inhaler, Inhale 2 puffs into the lungs in the morning and at bedtime., Disp: 1 each, Rfl: 1   buPROPion (WELLBUTRIN XL) 150 MG 24 hr tablet, Take 1 tablet (150 mg total) by mouth daily., Disp: 30 tablet, Rfl: 3   diclofenac (VOLTAREN) 75 MG EC tablet, Take 1 tablet (75 mg total) by mouth 2 (two) times daily., Disp: 60 tablet, Rfl: 2   DULoxetine (CYMBALTA) 60 MG capsule, Take 1 capsule (60 mg total) by mouth daily., Disp: 90 capsule, Rfl: 3    estradiol (ESTRACE) 1 MG tablet, Take 1 tablet (1 mg total) by mouth daily., Disp: 90 tablet, Rfl: 1   meclizine (ANTIVERT) 12.5 MG tablet, Take 12.5 mg by mouth 3 (three) times daily as needed for dizziness., Disp: , Rfl:    omeprazole (PRILOSEC) 40 MG capsule, Take 1 capsule (40 mg total) by mouth daily., Disp: 90 capsule, Rfl: 1   oxybutynin (DITROPAN-XL) 10 MG 24 hr tablet, Take 1 tablet (10 mg total) by mouth at bedtime., Disp: 90 tablet, Rfl: 1   topiramate (TOPAMAX) 50 MG tablet, Take 0.5 tablets (25 mg total) by mouth at bedtime for 7 days, THEN 1 tablet (50 mg total) at bedtime for 7 days, THEN 1 tablet (50 mg total) 2 (two) times daily for 7 days., Disp: 60 tablet, Rfl: 2   valsartan (DIOVAN) 160 MG tablet, Take 1 tablet (160 mg total) by mouth daily., Disp: 90 tablet, Rfl: 1   Vibegron (GEMTESA) 75 MG TABS, Take 1 tablet (75 mg total) by mouth daily., Disp: 30 tablet, Rfl: 5  Observations/Objective: Patient is well-developed, well-nourished in no acute distress.  Resting comfortably at home.  Head is normocephalic, atraumatic.  No labored breathing.  Speech is clear and coherent with logical content.  Patient is alert and oriented at baseline.    Assessment and Plan: 1. Norovirus (Primary) - ondansetron (ZOFRAN-ODT) 4 MG disintegrating tablet; Take 1-2 tablets (4-8 mg total) by mouth every 8 (eight) hours as needed.  Dispense: 20 tablet; Refill: 0 - dicyclomine (BENTYL) 10 MG capsule; Take 1 capsule (10 mg total) by mouth 4 (four) times daily -  before meals and at bedtime.  Dispense: 40 capsule; Refill: 0  - Suspect viral gastroenteritis, possible norovirus - Zofran for nausea - Bentyl for cramping and spasm of abdomen - Imodium to be continued for diarrhea - Push fluids, electrolyte beverages - Liquid diet, then increase to soft/bland (BRAT) diet over next day, then increase diet as tolerated - Seek in person evaluation if not improving or symptoms worsen   Follow Up  Instructions: I discussed the assessment and treatment plan with the patient. The patient was provided an opportunity to ask questions and all were answered. The patient agreed with the plan and demonstrated an understanding of the instructions.  A copy of instructions were sent to the patient via MyChart unless otherwise noted below.   Patient has requested to receive PHI (AVS, Work Notes, etc) pertaining to this video visit through e-mail as they are currently without active MyChart. They have voiced understand that email is not considered secure and their health information could be viewed by someone other than the patient.   The patient was advised to call back or seek an in-person evaluation if the symptoms worsen or if the condition fails to improve as anticipated.    [name], PA-C

## 2023-03-27 ENCOUNTER — Other Ambulatory Visit: Payer: BC Managed Care – PPO

## 2023-03-28 ENCOUNTER — Encounter: Payer: Self-pay | Admitting: Family Medicine

## 2023-04-07 ENCOUNTER — Other Ambulatory Visit: Payer: BC Managed Care – PPO

## 2023-05-06 ENCOUNTER — Ambulatory Visit: Payer: BC Managed Care – PPO | Admitting: Family Medicine

## 2023-06-19 ENCOUNTER — Encounter: Payer: Self-pay | Admitting: Family Medicine

## 2023-06-22 ENCOUNTER — Ambulatory Visit (HOSPITAL_COMMUNITY): Payer: Self-pay

## 2023-06-26 ENCOUNTER — Encounter: Payer: Self-pay | Admitting: Family Medicine

## 2023-06-26 DIAGNOSIS — J42 Unspecified chronic bronchitis: Secondary | ICD-10-CM

## 2023-06-27 ENCOUNTER — Encounter (HOSPITAL_COMMUNITY): Payer: Self-pay

## 2023-06-27 ENCOUNTER — Ambulatory Visit (INDEPENDENT_AMBULATORY_CARE_PROVIDER_SITE_OTHER): Payer: Self-pay

## 2023-06-27 ENCOUNTER — Ambulatory Visit (HOSPITAL_COMMUNITY)
Admission: RE | Admit: 2023-06-27 | Discharge: 2023-06-27 | Disposition: A | Discharge status: Home / Self Care | Payer: Self-pay | Source: Ambulatory Visit | Attending: Family Medicine | Admitting: Family Medicine

## 2023-06-27 ENCOUNTER — Other Ambulatory Visit: Payer: Self-pay | Admitting: Family Medicine

## 2023-06-27 ENCOUNTER — Ambulatory Visit: Payer: Self-pay | Admitting: Family Medicine

## 2023-06-27 VITALS — BP 159/92 | HR 84 | Temp 98.0°F | Resp 20 | Ht 64.0 in | Wt 220.2 lb

## 2023-06-27 DIAGNOSIS — J441 Chronic obstructive pulmonary disease with (acute) exacerbation: Secondary | ICD-10-CM

## 2023-06-27 DIAGNOSIS — J42 Unspecified chronic bronchitis: Secondary | ICD-10-CM

## 2023-06-27 MED ORDER — ALBUTEROL SULFATE (2.5 MG/3ML) 0.083% IN NEBU: 2.5000 mg | INHALATION_SOLUTION | Freq: Four times a day (QID) | RESPIRATORY_TRACT | 1 refills | Status: AC | PRN

## 2023-06-27 MED ORDER — IPRATROPIUM-ALBUTEROL 0.5-2.5 (3) MG/3ML IN SOLN
RESPIRATORY_TRACT | Status: AC
  Filled 2023-06-27: qty 3

## 2023-06-27 MED ORDER — ALBUTEROL SULFATE (2.5 MG/3ML) 0.083% IN NEBU
INHALATION_SOLUTION | RESPIRATORY_TRACT | Status: AC
Start: 2023-06-27 — End: 2023-06-27
  Filled 2023-06-27: qty 3

## 2023-06-27 MED ORDER — METHYLPREDNISOLONE SODIUM SUCC 125 MG IJ SOLR
125.0000 mg | Freq: Once | INTRAMUSCULAR | Status: AC
  Administered 2023-06-27: 125 mg via INTRAMUSCULAR

## 2023-06-27 MED ORDER — ALBUTEROL SULFATE (2.5 MG/3ML) 0.083% IN NEBU
2.5000 mg | INHALATION_SOLUTION | Freq: Once | RESPIRATORY_TRACT | Status: AC
  Administered 2023-06-27: 2.5 mg via RESPIRATORY_TRACT

## 2023-06-27 MED ORDER — BUDESONIDE-FORMOTEROL FUMARATE 80-4.5 MCG/ACT IN AERO: 2.0000 | INHALATION_SPRAY | Freq: Two times a day (BID) | RESPIRATORY_TRACT | 1 refills | Status: DC

## 2023-06-27 MED ORDER — METHYLPREDNISOLONE SODIUM SUCC 125 MG IJ SOLR
INTRAMUSCULAR | Status: AC
  Filled 2023-06-27: qty 2

## 2023-06-27 MED ORDER — IPRATROPIUM-ALBUTEROL 0.5-2.5 (3) MG/3ML IN SOLN
3.0000 mL | Freq: Once | RESPIRATORY_TRACT | Status: AC
  Administered 2023-06-27: 3 mL via RESPIRATORY_TRACT

## 2023-06-27 NOTE — ED Triage Notes (Signed)
 I am experiencing swelling and pain in both knees/also, breathing is bad/lots of rattling and coughing, been going on for over a month. - Entered by patient.  Patient presenting with Sob onset 1 month ago but worse today. Patient wheezing with SOB. States her aunt got them sick about a month ago. Prescriptions or OTC medications tried: Yes- Nebulizer and inhaler   with no relief.  Patient presenting with bilateral knee pain and swelling onset this week. Patient has been moving and the left knee is worse.

## 2023-06-27 NOTE — ED Provider Notes (Signed)
 [facility]    CSN: [id number] Arrival date & time: 06/27/23  0807      History   Chief Complaint Chief Complaint  Patient presents with   Knee Pain   Shortness of Breath    HPI [name] is a 58 y.o. female.    Knee Pain Associated symptoms: fatigue   Associated symptoms: no fever   Shortness of Breath Associated symptoms: cough and wheezing   Associated symptoms: no abdominal pain, no chest pain, no fever, no headaches, no sore throat and no vomiting   Shortness of breath, has a history of COPD states over the last month has had worsening symptoms which increased over the last 2 to 3 days.  It is coughing and wheezing.  Admits dyspnea on exertion.  Had recent URI 1 month ago and never fully resolved.  Has chronic symptoms which are treated with rescue inhalers only.  She is not currently on any inhaled steroids Denies chest pain, palpitations, documented fever, chills, sweats, nausea, vomiting.  Admits lower extremity swelling denies recent travel.  Also complaining of knee pain. She is a former smoker quit 1 month ago. Has a PCP.  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 Savannah Curlin, repeat in 7 years   VIN II (vulvar intraepithelial neoplasia II)     Patient Active  Problem List   Diagnosis Date Noted   Depression, major, single episode, moderate (HCC) 03/05/2023   Headache with neurologic deficit 03/05/2023   Obstructive sleep apnea 03/04/2023   Prediabetes 12/03/2022   Primary hypertension 07/29/2022   Urge incontinence 07/29/2022   Abdominal pain, epigastric 05/25/2021   Nausea and vomiting 05/25/2021   Loose stools 05/25/2021   Menopausal symptoms 07/05/2020   Facet arthritis of cervical region 09/09/2019   Tubular adenoma of colon 06/16/2019   Urinary incontinence 04/09/2019   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   Bilateral carpal tunnel syndrome 06/24/2017   Primary osteoarthritis of first carpometacarpal joint of right hand 06/24/2017   Chronic low back pain 06/23/2012   Chronic bronchitis (HCC) 11/19/2010   Drug addiction in remission (HCC) 11/19/2010   Common migraine 03/23/2010   GERD (gastroesophageal reflux disease) 03/12/2010   Obesity (BMI 35.0-39.9 without comorbidity) 03/12/2010   Tobacco use disorder 12/11/2009   Major depression, recurrent (HCC) 03/03/2009    Past Surgical History:  Procedure Laterality Date   ABDOMINAL HYSTERECTOMY     CARPAL TUNNEL RELEASE Bilateral    11/2019   CERVICAL CONE BIOPSY  1996   Laser   CHOLECYSTECTOMY N/A 04/26/2022   Procedure: LAPAROSCOPIC CHOLECYSTECTOMY WITH ICG DYE;  Surgeon: Enid Harry, MD;  Location: [facility];  Service: General;  Laterality: N/A;  GEN AND TAP BLOCK   COLONOSCOPY     LASER ABLATION OF THE CERVIX     MYRINGOTOMY     Bilateral, age 68 and 9   TONSILLECTOMY AND ADENOIDECTOMY     TOTAL VAGINAL HYSTERECTOMY  07/2007   TVH-menorrhagia, dysmenorrhea, still have fallopians tubes and ovaries   TUBAL LIGATION     Tubal Ligation/Steriization   VULVECTOMY N/A 09/25/2017   Procedure: PARTIAL VULVECTOMY;  Surgeon: Lyn Sanders, MD;  Location: [facility];  Service: Gynecology;  Laterality: N/A;   WISDOM TOOTH EXTRACTION       OB History   No obstetric history on file.      Home Medications    Prior to Admission medications   Medication Sig Start Date End Date Taking? Authorizing Provider  acetaminophen  (TYLENOL ) 500 MG tablet Take 1,000 mg by mouth every 6 (six) hours as needed for moderate pain.   Yes [provider]  albuterol  (PROVENTIL ) (2.5 MG/3ML) 0.083% nebulizer solution Take 3 mLs (2.5 mg total) by nebulization every 6 (six) hours as needed for wheezing or shortness of breath. 12/03/22  Yes Aida House, MD  albuterol  (VENTOLIN  HFA) 108 [phone] Base) MCG/ACT inhaler Inhale 2 puffs into the lungs every 4 (four) hours as needed for wheezing or shortness of breath. 07/11/22  Yes Rodriguez-Southworth, Lamond Pilot, PA-C  diclofenac  (VOLTAREN ) 75 MG EC tablet Take 1 tablet (75 mg total) by mouth 2 (two) times daily. 03/04/23  Yes Aida House, MD  dicyclomine  (BENTYL ) 10 MG capsule Take 1 capsule (10 mg total) by mouth 4 (four) times daily -  before meals and at bedtime. 03/19/23  Yes Angelia Kelp, PA-C  DULoxetine  (CYMBALTA ) 60 MG capsule Take 1 capsule (60 mg total) by mouth daily. 03/04/23  Yes Aida House, MD  estradiol  (ESTRACE ) 1 MG tablet Take 1 tablet (1 mg total) by mouth daily. 03/04/23  Yes Aida House, MD  meclizine  (ANTIVERT ) 12.5 MG tablet Take 12.5 mg by mouth 3 (three) times daily as needed for dizziness.   Yes [provider]  omeprazole  (PRILOSEC) 40 MG capsule Take 1 capsule (40 mg total) by mouth daily. 03/04/23  Yes Aida House, MD  ondansetron  (ZOFRAN ) 4 MG tablet Take 1-2 tablets (4-8 mg total) by mouth every 8 (eight) hours as needed for nausea or vomiting. 03/19/23  Yes Angelia Kelp, PA-C  ondansetron  (ZOFRAN -ODT) 4 MG disintegrating tablet Take 1-2 tablets (4-8 mg total) by mouth every 8 (eight) hours as needed. 03/19/23  Yes Angelia Kelp, PA-C  oxybutynin  (DITROPAN -XL) 10 MG 24 hr tablet Take 1 tablet (10 mg total) by mouth at  bedtime. 12/03/22  Yes Aida House, MD  valsartan  (DIOVAN ) 160 MG tablet Take 1 tablet (160 mg total) by mouth daily. 03/04/23  Yes Aida House, MD  Vibegron  (GEMTESA ) 75 MG TABS Take 1 tablet (75 mg total) by mouth daily. 03/04/23  Yes Aida House, MD  budesonide -formoterol  (SYMBICORT ) 80-4.5 MCG/ACT inhaler Inhale 2 puffs into the lungs in the morning and at bedtime. 06/27/23   Laroy Mustard, PA  buPROPion  (WELLBUTRIN  XL) 150 MG 24 hr tablet Take 1 tablet (150 mg total) by mouth daily. 03/04/23   Aida House, MD  topiramate  (TOPAMAX ) 50 MG tablet Take 0.5 tablets (25 mg total) by mouth at bedtime for 7 days, THEN 1 tablet (50 mg total) at bedtime for 7 days, THEN 1 tablet (50 mg total)  2 (two) times daily for 7 days. 03/04/23 03/25/23  Aida House, MD    Family History Family History  Problem Relation Age of Onset   Heart failure Mother    Diabetes Mother    Cancer Father    COPD Maternal Grandmother    Emphysema Maternal Grandmother    Arthritis Maternal Grandmother    Lung cancer Maternal Grandfather    Breast cancer Paternal Grandmother    Tuberculosis Paternal Grandmother    Stroke Paternal Grandmother    Heart failure Paternal Grandfather    High blood pressure Paternal Grandfather    Colon cancer Neg Hx    Colon polyps Neg Hx    Esophageal cancer Neg Hx    Rectal cancer Neg Hx    Stomach cancer Neg Hx     Social History Social History   Tobacco Use   Smoking status: Former    Current packs/day: 0.00    Average packs/day: 0.5 packs/day for 40.0 years (20.0 ttl pk-yrs)    Types: Cigarettes    Start date: 04/09/1981    Quit date: 04/09/2021    Years since quitting: 2.2   Smokeless tobacco: Never   Tobacco comments:    Patient smokes less than 1/2 pack of cigarettes a day-quit 1 week ago  Vaping Use   Vaping status: Never Used  Substance Use Topics   Alcohol use: Yes    Comment: occasionally   Drug use: Not Currently    Types:  Marijuana, Crack cocaine    Comment: 20 years clean per pt     Allergies   Ketorolac , Codeine , Metronidazole, and Latex   Review of Systems Review of Systems  Constitutional:  Positive for fatigue. Negative for appetite change and fever.  HENT:  Positive for congestion. Negative for rhinorrhea and sore throat.   Respiratory:  Positive for cough, chest tightness, shortness of breath and wheezing.   Cardiovascular:  Positive for leg swelling. Negative for chest pain and palpitations.  Gastrointestinal:  Negative for abdominal pain, diarrhea, nausea and vomiting.  Neurological:  Negative for dizziness and headaches.     Physical Exam Triage Vital Signs ED Triage Vitals  Encounter Vitals Group     BP 06/27/23 0836 (!) 159/92     Girls Systolic BP Percentile --      Girls Diastolic BP Percentile --      Boys Systolic BP Percentile --      Boys Diastolic BP Percentile --      Pulse Rate 06/27/23 0836 89     Resp 06/27/23 0836 (!) 22     Temp 06/27/23 0836 98 F (36.7 C)     Temp Source 06/27/23 0836 Oral     SpO2 06/27/23 0836 (!) 88 %     Weight 06/27/23 0836 220 lb 3.8 oz (99.9 kg)     Height 06/27/23 0836 5' 4 (1.626 m)     Head Circumference --      Peak Flow --      Pain Score 06/27/23 0833 7     Pain Loc --      Pain Education --      Exclude from Growth Chart --    No data found.  Updated Vital Signs BP (!) 159/92 (BP Location: Left Arm)   Pulse 84   Temp 98 F (36.7 C) (Oral)   Resp 20   Ht 5' 4 (1.626 m)   Wt 220 lb 3.8 oz (99.9 kg)   SpO2 98%  BMI 37.80 kg/m   Visual Acuity Right Eye Distance:   Left Eye Distance:   Bilateral Distance:    Right Eye Near:   Left Eye Near:    Bilateral Near:     Physical Exam Vitals and nursing note reviewed.  Constitutional:      Appearance: She is obese.     Comments: Tachypneic, dyspneic with speech, creased respiratory rate and effort   Cardiovascular:     Rate and Rhythm: Normal rate and regular  rhythm.  Pulmonary:     Breath sounds: Decreased breath sounds and wheezing present. No rhonchi.  Abdominal:     Palpations: Abdomen is soft.   Musculoskeletal:     Cervical back: Neck supple.     Right lower leg: Edema present.     Left lower leg: Edema present.     Comments: Mild bilateral edema to lower legs, no calf tenderness, symmetric   Skin:    General: Skin is warm and dry.   Neurological:     Mental Status: She is oriented to person, place, and time.   Psychiatric:        Mood and Affect: Mood normal.      UC Treatments / Results  Labs (all labs ordered are listed, but only abnormal results are displayed) Labs Reviewed - No data to display  EKG   Radiology DG Chest 2 View Result Date: 06/27/2023 CLINICAL DATA:  One-month history of shortness of breath EXAM: CHEST - 2 VIEW COMPARISON:  Chest radiograph dated 11/21/2022 FINDINGS: Normal lung volumes. No focal consolidations. No pleural effusion or pneumothorax. The heart size and mediastinal contours are within normal limits. No acute osseous abnormality. IMPRESSION: No active cardiopulmonary disease. Electronically Signed   By: Limin  Xu M.D.   On: 06/27/2023 09:38    Procedures Procedures (including critical care time)  Medications Ordered in UC Medications  methylPREDNISolone sodium succinate (SOLU-MEDROL) 125 mg/2 mL injection 125 mg (125 mg Intramuscular Given 06/27/23 0855)  ipratropium-albuterol  (DUONEB) 0.5-2.5 (3) MG/3ML nebulizer solution 3 mL (3 mLs Nebulization Given 06/27/23 0846)  albuterol  (PROVENTIL ) (2.5 MG/3ML) 0.083% nebulizer solution 2.5 mg (2.5 mg Nebulization Given 06/27/23 0928)    Initial Impression / Assessment and Plan / UC Course  I have reviewed the triage vital signs and the nursing notes.  Pertinent labs & imaging results that were available during my care of the patient were reviewed by me and considered in my medical decision making (see chart for details).     58 year old  female with history of COPD recently quit smoking with exacerbation of symptoms x 1 month worse over the last few days after having upper respiratory infection.  She was in mild distress upon arrival was given DuoNeb and Solu-Medrol, with marked improvement, her oxygen saturation went up to 95% with first treatment.  She continued to have some wheezing was giving a second treatment.  Her symptoms improved greatly and her oxygen saturation was 98%, she states she felt much better.  Discussed with patient she likely needs to be on inhaled corticosteroid will Rx Symbicort  today, she has enough of her rescue medicines at home she was encouraged to follow-up with PCP as soon as possible for further evaluation and told to go to the emergency department for worsening symptoms new symptoms or concerns   Final Clinical Impressions(s) / UC Diagnoses   Final diagnoses:  Chronic bronchitis, unspecified chronic bronchitis type (HCC)  COPD exacerbation [facility])     Discharge Instructions  Follow-up with your primary care doctor on Monday.  Go to the emergency department for severe symptoms new symptoms or concerns specifically for development of fever, chest pain or worsening shortness of breath.  Continue your rescue inhalers.     ED Prescriptions     Medication Sig Dispense Auth. Provider   budesonide -formoterol  (SYMBICORT ) 80-4.5 MCG/ACT inhaler Inhale 2 puffs into the lungs in the morning and at bedtime. 1 each Rosealie Reach, PA      PDMP not reviewed this encounter.   Paiton Fosco, [place] 06/27/23 [phone]

## 2023-06-27 NOTE — Discharge Instructions (Signed)
 Follow-up with your primary care doctor on Monday.  Go to the emergency department for severe symptoms new symptoms or concerns specifically for development of fever, chest pain or worsening shortness of breath.  Continue your rescue inhalers.

## 2023-07-16 NOTE — Progress Notes (Signed)
 Comprehensive Outpatient [facility] Quality Team Note  Name: [name] Date of Birth: 04/11/1965 MRN: [id number] Date: 07/16/2023  [facility] Quality Team has reviewed this patient's chart, please see recommendations below:  [facility] Quality Other; (Chart reviewed. Most recent blood pressure out of range.)

## 2023-08-22 ENCOUNTER — Encounter: Payer: Self-pay | Admitting: Family Medicine

## 2023-08-22 ENCOUNTER — Ambulatory Visit (INDEPENDENT_AMBULATORY_CARE_PROVIDER_SITE_OTHER): Admitting: Family Medicine

## 2023-08-22 VITALS — BP 144/96 | HR 82 | Temp 98.5°F | Ht 64.25 in | Wt 221.8 lb

## 2023-08-22 DIAGNOSIS — Z0000 Encounter for general adult medical examination without abnormal findings: Secondary | ICD-10-CM

## 2023-08-22 DIAGNOSIS — E669 Obesity, unspecified: Secondary | ICD-10-CM | POA: Diagnosis not present

## 2023-08-22 DIAGNOSIS — I10 Essential (primary) hypertension: Secondary | ICD-10-CM

## 2023-08-22 DIAGNOSIS — R42 Dizziness and giddiness: Secondary | ICD-10-CM

## 2023-08-22 DIAGNOSIS — R7303 Prediabetes: Secondary | ICD-10-CM

## 2023-08-22 LAB — HEMOGLOBIN A1C: Hgb A1c MFr Bld: 6.3 % (ref 4.6–6.5)

## 2023-08-22 LAB — LIPID PANEL
Cholesterol: 229 mg/dL — ABNORMAL HIGH (ref 0–200)
HDL: 55.7 mg/dL (ref >39.00)
LDL Cholesterol: 148 mg/dL — ABNORMAL HIGH (ref 0–99)
NonHDL: 173.35
Total CHOL/HDL Ratio: 4
Triglycerides: 128 mg/dL (ref 0.0–149.0)
VLDL: 25.6 mg/dL (ref 0.0–40.0)

## 2023-08-22 LAB — COMPREHENSIVE METABOLIC PANEL WITH GFR
ALT: 20 U/L (ref 0–35)
AST: 18 U/L (ref 0–37)
Albumin: 4.3 g/dL (ref 3.5–5.2)
Alkaline Phosphatase: 80 U/L (ref 39–117)
BUN: 15 mg/dL (ref 6–23)
CO2: 32 meq/L (ref 19–32)
Calcium: 9.1 mg/dL (ref 8.4–10.5)
Chloride: 103 meq/L (ref 96–112)
Creatinine, Ser: 0.88 mg/dL (ref 0.40–1.20)
GFR: 73.49 mL/min (ref >60.00)
Glucose, Bld: 93 mg/dL (ref 70–99)
Potassium: 4.4 meq/L (ref 3.5–5.1)
Sodium: 142 meq/L (ref 135–145)
Total Bilirubin: 0.4 mg/dL (ref 0.2–1.2)
Total Protein: 7 g/dL (ref 6.0–8.3)

## 2023-08-22 LAB — TSH: TSH: 5.98 u[IU]/mL — ABNORMAL HIGH (ref 0.35–5.50)

## 2023-08-22 IMAGING — DX DG WRIST COMPLETE 3+V*R*
4 series · 4 of 4 positions shown · non-contrast
Comparison: None.

CLINICAL DATA: Right wrist pain, no known injury

EXAM:
RIGHT WRIST - COMPLETE 3+ VIEW

[wrist pa]
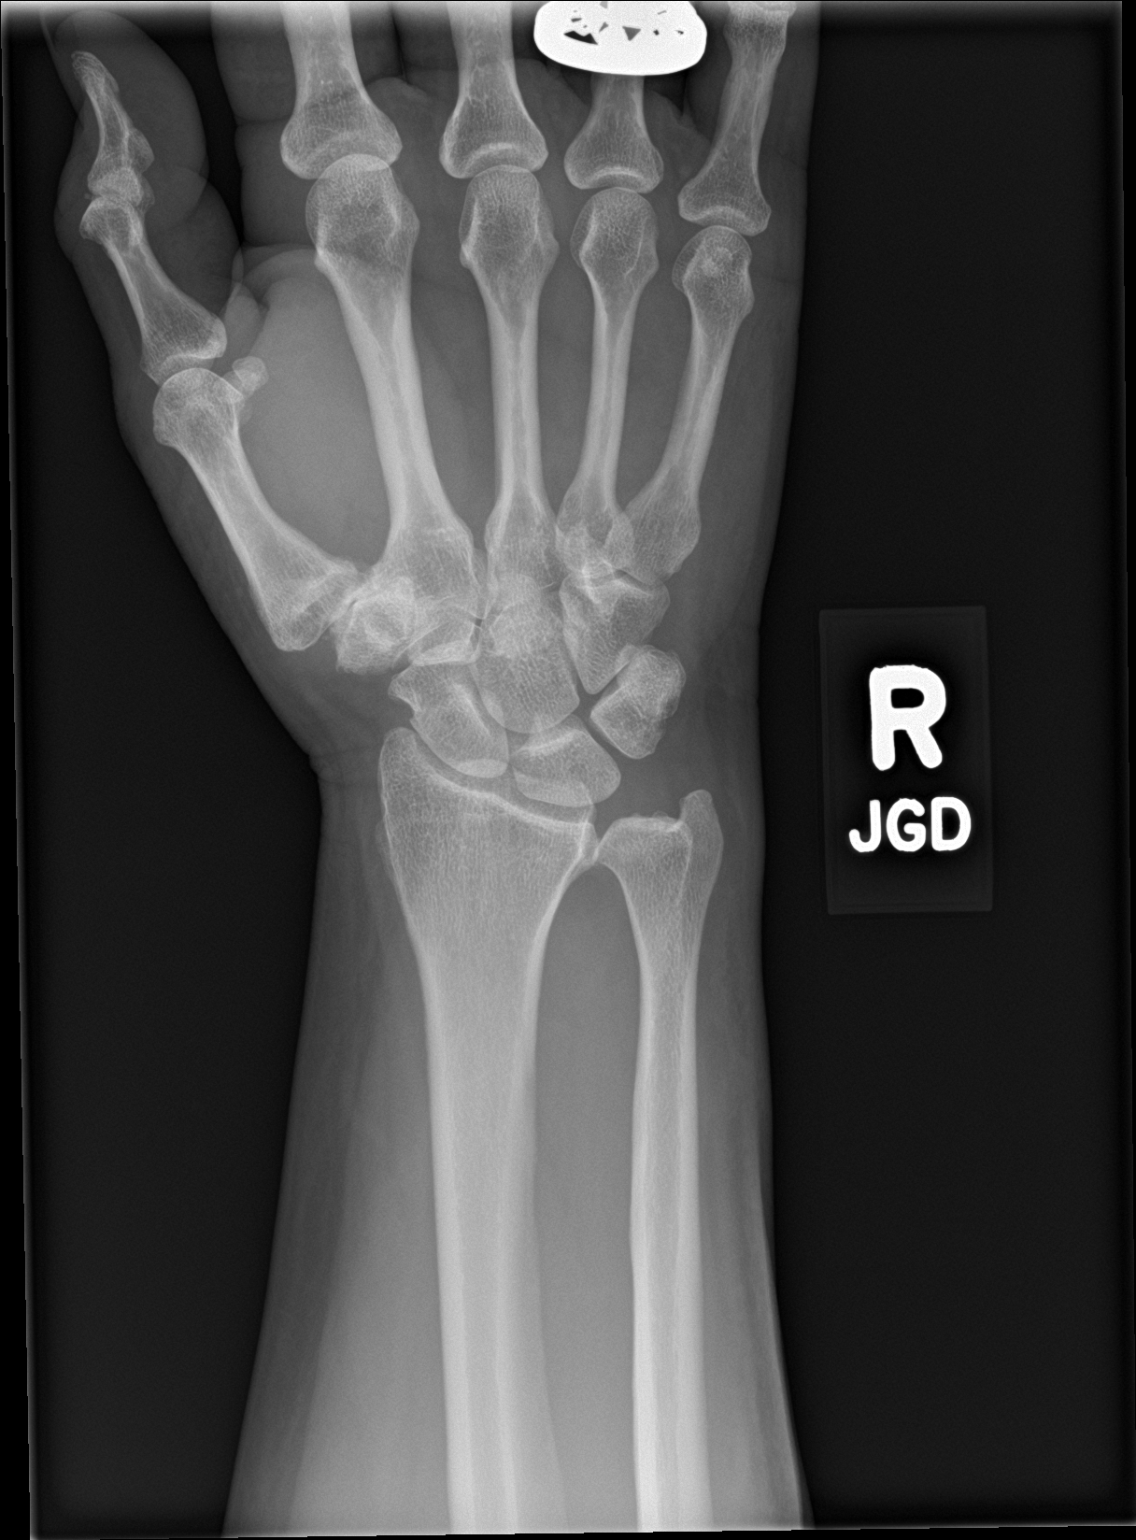

[wrist navicular]
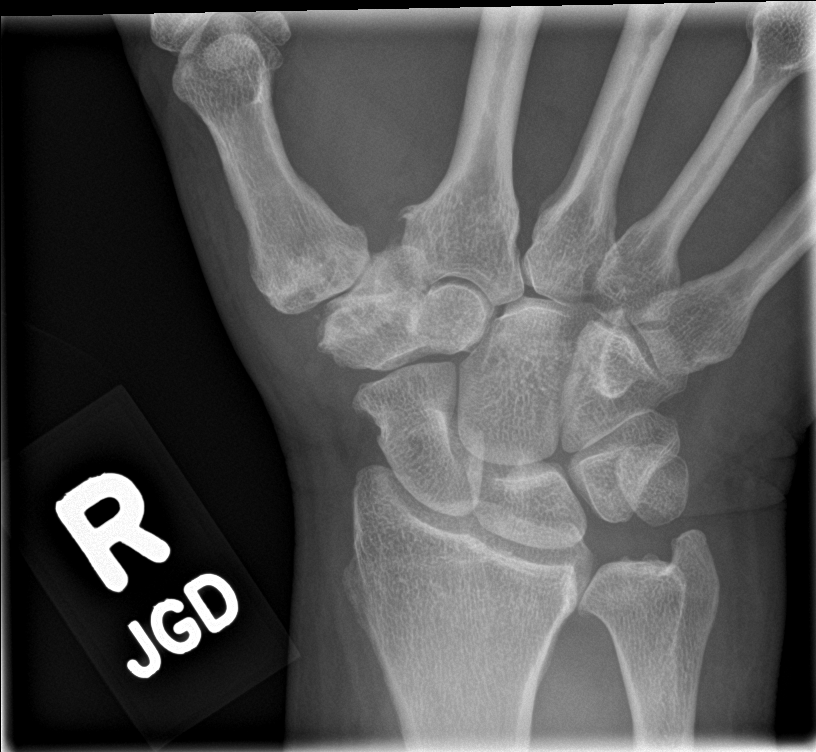

[wrist obl]
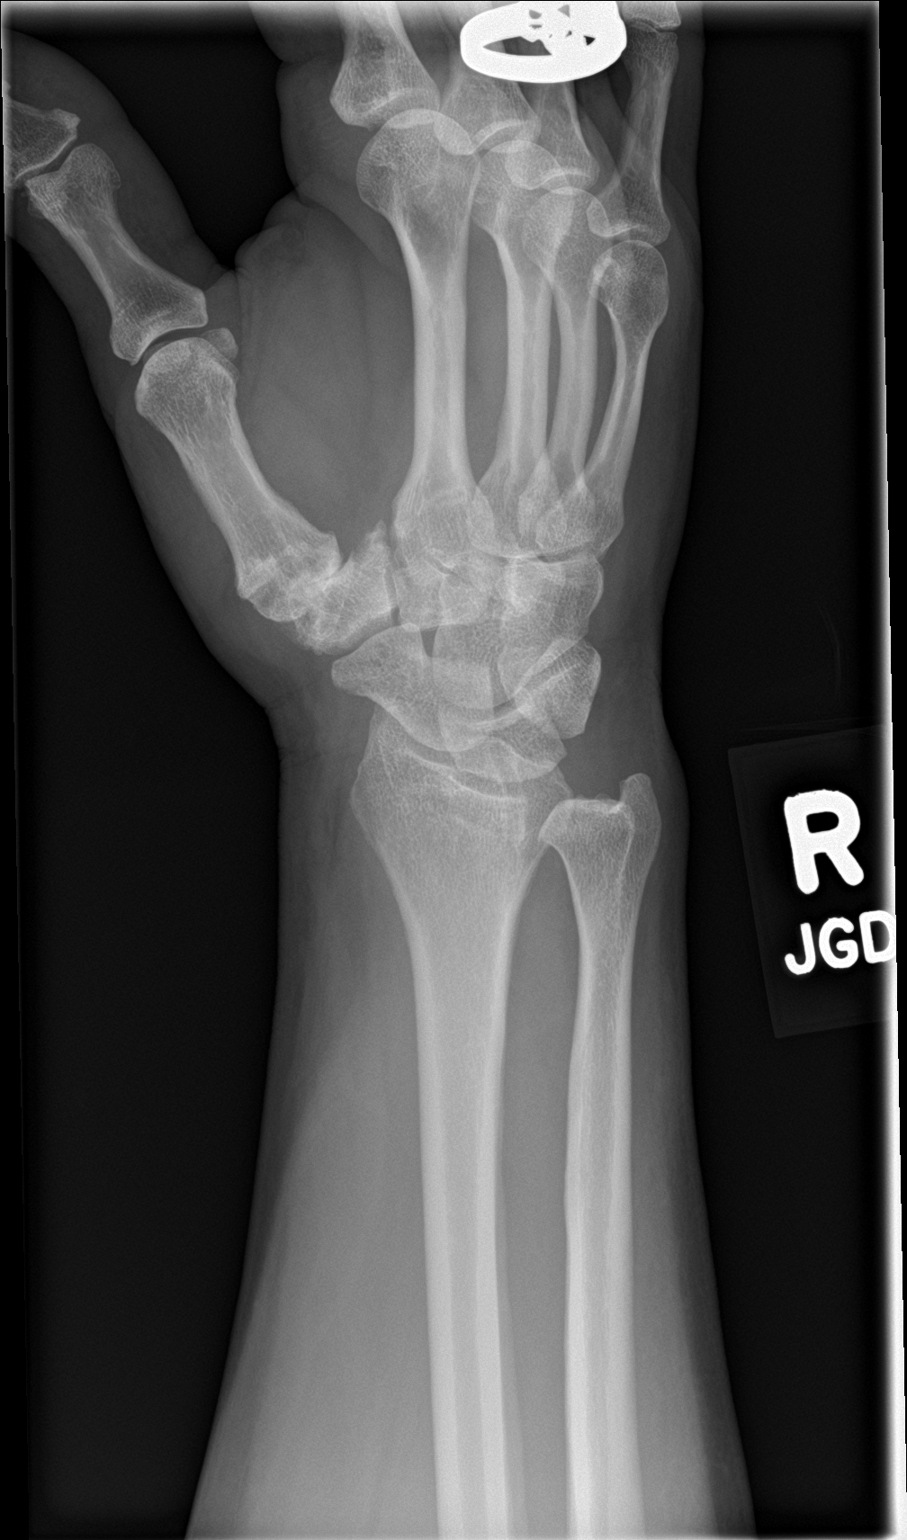

[wrist lat]
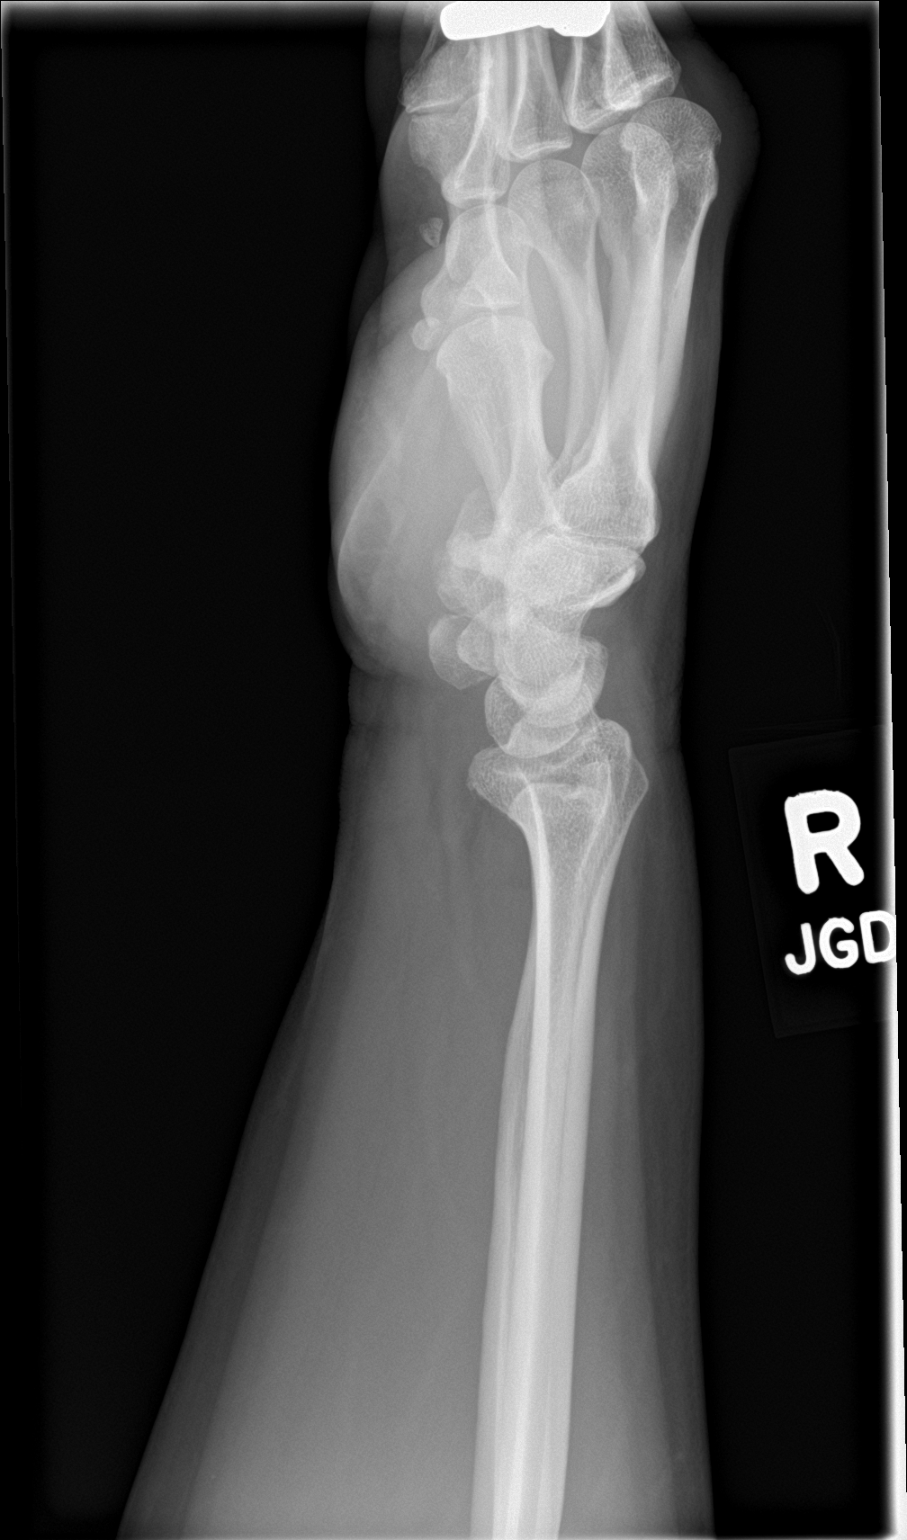

[4 of 4 positions shown; findings below may reference images not displayed]

FINDINGS: There is no evidence of fracture or dislocation. The carpus is
normally aligned. Mild thumb basal arthrosis with otherwise
preserved joint spaces. Soft tissues are unremarkable.
IMPRESSION: 1. No fracture or dislocation of the right wrist. The carpus is
normally aligned.

2. Mild thumb basal arthrosis with otherwise preserved joint spaces.

## 2023-08-22 MED ORDER — WEGOVY 0.25 MG/0.5ML ~~LOC~~ SOAJ: 0.2500 mg | SUBCUTANEOUS | 0 refills | Status: DC

## 2023-08-22 MED ORDER — VALSARTAN 160 MG PO TABS: 160.0000 mg | ORAL_TABLET | Freq: Every day | ORAL | 1 refills | Status: AC

## 2023-08-22 NOTE — Assessment & Plan Note (Addendum)
 I have had an extensive 30 minute conversation today with the patient about healthy eating habits, exercise, calorie and carb goals for sustainable and successful weight loss. I gave the patient caloric and protein daily intake values as well as described the importance of increasing fiber and water intake. I discussed weight loss medications that could be used in the treatment of this patient. Handouts on low carb eating were given to the patient. The diet plan I gave the patient is listed below:  Total calorie intake per day: 1650  Total carb intake per day: about 90 grams or less   Total protein per ay: at least 100 grams per   Pt will also engage in an exercise program consisting of 30 minutes of cardio and 30 minutes of strength training three times per week. Patient voiced understanding that she will continue the diet and exercise plan I gave her today while she is taking the GLP medication. RTC in 3 months for weight check.

## 2023-08-22 NOTE — Patient Instructions (Addendum)
 Total calorie intake per day: 1650  Total carb intake per day: about 90 grams or less   Total protein per ay: at least 100 grams per day  30 minutes of cardio and 30 minutes of strength training three times per week  Health Maintenance, Female Adopting a healthy lifestyle and getting preventive care are important in promoting health and wellness. Ask your health care provider about: The right schedule for you to have regular tests and exams. Things you can do on your own to prevent diseases and keep yourself healthy. What should I know about diet, weight, and exercise? Eat a healthy diet  Eat a diet that includes plenty of vegetables, fruits, low-fat dairy products, and lean protein. Do not eat a lot of foods that are high in solid fats, added sugars, or sodium. Maintain a healthy weight Body mass index (BMI) is used to identify weight problems. It estimates body fat based on height and weight. Your health care provider can help determine your BMI and help you achieve or maintain a healthy weight. Get regular exercise Get regular exercise. This is one of the most important things you can do for your health. Most adults should: Exercise for at least 150 minutes each week. The exercise should increase your heart rate and make you sweat (moderate-intensity exercise). Do strengthening exercises at least twice a week. This is in addition to the moderate-intensity exercise. Spend less time sitting. Even light physical activity can be beneficial. Watch cholesterol and blood lipids Have your blood tested for lipids and cholesterol at 56 years of age, then have this test every 5 years. Have your cholesterol levels checked more often if: Your lipid or cholesterol levels are high. You are older than 56 years of age. You are at high risk for heart disease. What should I know about cancer screening? Depending on your health history and family history, you may need to have cancer screening at  various ages. This may include screening for: Breast cancer. Cervical cancer. Colorectal cancer. Skin cancer. Lung cancer. What should I know about heart disease, diabetes, and high blood pressure? Blood pressure and heart disease High blood pressure causes heart disease and increases the risk of stroke. This is more likely to develop in people who have high blood pressure readings or are overweight. Have your blood pressure checked: Every 3-5 years if you are 81-30 years of age. Every year if you are 30 years old or older. Diabetes Have regular diabetes screenings. This checks your fasting blood sugar level. Have the screening done: Once every three years after age 28 if you are at a normal weight and have a low risk for diabetes. More often and at a younger age if you are overweight or have a high risk for diabetes. What should I know about preventing infection? Hepatitis B If you have a higher risk for hepatitis B, you should be screened for this virus. Talk with your health care provider to find out if you are at risk for hepatitis B infection. Hepatitis C Testing is recommended for: Everyone born from [birth year] through 1963. Anyone with known risk factors for hepatitis C. Sexually transmitted infections (STIs) Get screened for STIs, including gonorrhea and chlamydia, if: You are sexually active and are younger than 56 years of age. You are older than 56 years of age and your health care provider tells you that you are at risk for this type of infection. Your sexual activity has changed since you were last screened, and you  are at increased risk for chlamydia or gonorrhea. Ask your health care provider if you are at risk. Ask your health care provider about whether you are at high risk for HIV. Your health care provider may recommend a prescription medicine to help prevent HIV infection. If you choose to take medicine to prevent HIV, you should first get tested for HIV. You should then be  tested every 3 months for as long as you are taking the medicine. Pregnancy If you are about to stop having your period (premenopausal) and you may become pregnant, seek counseling before you get pregnant. Take 400 to 800 micrograms (mcg) of folic acid every day if you become pregnant. Ask for birth control (contraception) if you want to prevent pregnancy. Osteoporosis and menopause Osteoporosis is a disease in which the bones lose minerals and strength with aging. This can result in bone fractures. If you are 80 years old or older, or if you are at risk for osteoporosis and fractures, ask your health care provider if you should: Be screened for bone loss. Take a calcium or vitamin D supplement to lower your risk of fractures. Be given hormone replacement therapy (HRT) to treat symptoms of menopause. Follow these instructions at home: Alcohol use Do not drink alcohol if: Your health care provider tells you not to drink. You are pregnant, may be pregnant, or are planning to become pregnant. If you drink alcohol: Limit how much you have to: 0-1 drink a day. Know how much alcohol is in your drink. In the [place] one drink equals one 12 oz bottle of beer (355 mL), one 5 oz glass of wine (148 mL), or one 1 oz glass of hard liquor (44 mL). Lifestyle Do not use any products that contain nicotine or tobacco. These products include cigarettes, chewing tobacco, and vaping devices, such as e-cigarettes. If you need help quitting, ask your health care provider. Do not use street drugs. Do not share needles. Ask your health care provider for help if you need support or information about quitting drugs. General instructions Schedule regular health, dental, and eye exams. Stay current with your vaccines. Tell your health care provider if: You often feel depressed. You have ever been abused or do not feel safe at home. Summary Adopting a healthy lifestyle and getting preventive care are important in  promoting health and wellness. Follow your health care provider's instructions about healthy diet, exercising, and getting tested or screened for diseases. Follow your health care provider's instructions on monitoring your cholesterol and blood pressure. This information is not intended to replace advice given to you by your health care provider. Make sure you discuss any questions you have with your health care provider. Document Revised: 05/22/2020 Document Reviewed: 05/22/2020 Elsevier Patient Education  2024 [employer].

## 2023-08-22 NOTE — Progress Notes (Signed)
 Complete physical exam  Patient: [name]   DOB: 02/20/1965   58 y.o. Female  MRN: [id number]  Subjective:    Chief Complaint  Patient presents with   Annual Exam    [name] is a 58 y.o. female who presents today for a complete physical exam. She reports consuming a general diet. The patient does not participate in regular exercise at present. She generally feels well. She reports sleeping fairly well. She does have additional problems to discuss today.   Pt reports that she stopped smoking recently, is nicotine free. We discussed the results of the sleep study. She wants to lose weight to treat her OSA symptoms,   HTN -- pt reports she stopped taking all of her medications due to being without insurance. She is not checking BP at home, no headaches, chest pain or SOB, we discussed restarting her valsartan  and she is agreeable to this plan.  Obesity -- pt reports that she has struggled with weight gain for several years, has tried many different diets in the past including weight watchers, calorie control, etc. Tries to stay active and walk but it can be challenging. Her mother passed away over christmas 2024 which led to additional weight gain, she has tried wellbutrin  and topiramate  without any success.   Vertigo -- pt is reporting continused vertigo symptoms, states she gets intermittent episodes when she looks up or looks down for long periods. States that this particular episode has persisted, she has been doing the Epley maneuvers at home and was previously taking antivert  without much improvement. Is requesting referral to ENT. Most recent fall risk assessment:     No data to display           Most recent depression screenings:    08/22/2023    1:01 PM 03/04/2023    4:08 PM  PHQ 2/9 Scores  PHQ - 2 Score 1 4  PHQ- 9 Score 5 12    Vision:Not within last year  and Dental: No current dental problems and Last dental visit: 20 years ago  Patient Active  Problem List   Diagnosis Date Noted   Depression, major, single episode, moderate (HCC) 03/05/2023   Headache with neurologic deficit 03/05/2023   Obstructive sleep apnea 03/04/2023   Prediabetes 12/03/2022   Primary hypertension 07/29/2022   Urge incontinence 07/29/2022   Abdominal pain, epigastric 05/25/2021   Nausea and vomiting 05/25/2021   Loose stools 05/25/2021   Menopausal symptoms 07/05/2020   Facet arthritis of cervical region 09/09/2019   Tubular adenoma of colon 06/16/2019   Urinary incontinence 04/09/2019   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   Bilateral carpal tunnel syndrome 06/24/2017   Primary osteoarthritis of first carpometacarpal joint of right hand 06/24/2017   Chronic low back pain 06/23/2012   Chronic bronchitis (HCC) 11/19/2010   Drug addiction in remission (HCC) 11/19/2010   Common migraine 03/23/2010   GERD (gastroesophageal reflux disease) 03/12/2010   Obesity (BMI 35.0-39.9 without comorbidity) 03/12/2010   Tobacco use disorder 12/11/2009   Major depression, recurrent (HCC) 03/03/2009      Patient Care Team: Ozell Heron HERO, MD as PCP - General (Family Medicine) Anitra Freddy NOVAK, MD (Inactive) as Consulting Physician (Gynecologic Oncology) Danielle Rom, MD as Consulting Physician (Obstetrics and Gynecology) Eda Iha, MD (Inactive) as Consulting Physician (Gastroenterology)   Outpatient Medications Prior to Visit  Medication Sig   albuterol  (PROVENTIL ) (2.5 MG/3ML) 0.083% nebulizer solution Take 3 mLs (2.5 mg total) by nebulization  every 6 (six) hours as needed for wheezing or shortness of breath.   albuterol  (VENTOLIN  HFA) 108 (90 Base) MCG/ACT inhaler Inhale 2 puffs into the lungs every 4 (four) hours as needed for wheezing or shortness of breath.   budesonide -formoterol  (SYMBICORT ) 80-4.5 MCG/ACT inhaler Inhale 2 puffs into the lungs in the morning and at bedtime.   omeprazole  (PRILOSEC) 40 MG capsule  Take 1 capsule (40 mg total) by mouth daily.   [DISCONTINUED] acetaminophen  (TYLENOL ) 500 MG tablet Take 1,000 mg by mouth every 6 (six) hours as needed for moderate pain.   [DISCONTINUED] buPROPion  (WELLBUTRIN  XL) 150 MG 24 hr tablet Take 1 tablet (150 mg total) by mouth daily.   [DISCONTINUED] diclofenac  (VOLTAREN ) 75 MG EC tablet Take 1 tablet (75 mg total) by mouth 2 (two) times daily.   [DISCONTINUED] dicyclomine  (BENTYL ) 10 MG capsule Take 1 capsule (10 mg total) by mouth 4 (four) times daily -  before meals and at bedtime.   [DISCONTINUED] DULoxetine  (CYMBALTA ) 60 MG capsule Take 1 capsule (60 mg total) by mouth daily.   [DISCONTINUED] estradiol  (ESTRACE ) 1 MG tablet Take 1 tablet (1 mg total) by mouth daily.   [DISCONTINUED] meclizine  (ANTIVERT ) 12.5 MG tablet Take 12.5 mg by mouth 3 (three) times daily as needed for dizziness.   [DISCONTINUED] ondansetron  (ZOFRAN ) 4 MG tablet Take 1-2 tablets (4-8 mg total) by mouth every 8 (eight) hours as needed for nausea or vomiting.   [DISCONTINUED] ondansetron  (ZOFRAN -ODT) 4 MG disintegrating tablet Take 1-2 tablets (4-8 mg total) by mouth every 8 (eight) hours as needed.   [DISCONTINUED] oxybutynin  (DITROPAN -XL) 10 MG 24 hr tablet Take 1 tablet (10 mg total) by mouth at bedtime.   [DISCONTINUED] topiramate  (TOPAMAX ) 50 MG tablet Take 0.5 tablets (25 mg total) by mouth at bedtime for 7 days, THEN 1 tablet (50 mg total) at bedtime for 7 days, THEN 1 tablet (50 mg total) 2 (two) times daily for 7 days.   [DISCONTINUED] valsartan  (DIOVAN ) 160 MG tablet Take 1 tablet (160 mg total) by mouth daily.   [DISCONTINUED] Vibegron  (GEMTESA ) 75 MG TABS Take 1 tablet (75 mg total) by mouth daily.   No facility-administered medications prior to visit.    Review of Systems  HENT:  Negative for hearing loss.   Eyes:  Negative for blurred vision.  Respiratory:  Negative for shortness of breath.   Cardiovascular:  Negative for chest pain.  Gastrointestinal:  Negative.   Genitourinary: Negative.   Musculoskeletal:  Negative for back pain.  Neurological:  Negative for headaches.  Psychiatric/Behavioral:  Negative for depression.        Objective:     BP (!) 144/96   Pulse 82   Temp 98.5 F (36.9 C) (Oral)   Ht 5' 4.25 (1.632 m)   Wt 221 lb 12.8 oz (100.6 kg)   SpO2 95%   BMI 37.78 kg/m    Physical Exam Vitals reviewed.  Constitutional:      Appearance: Normal appearance. She is well-groomed. She is obese.  HENT:     Right Ear: Tympanic membrane and ear canal normal.     Left Ear: Tympanic membrane and ear canal normal.     Mouth/Throat:     Mouth: Mucous membranes are moist.     Pharynx: No posterior oropharyngeal erythema.  Eyes:     Conjunctiva/sclera: Conjunctivae normal.  Neck:     Thyroid : No thyromegaly.  Cardiovascular:     Rate and Rhythm: Normal rate and regular rhythm.  Pulses: Normal pulses.     Heart sounds: S1 normal and S2 normal.  Pulmonary:     Effort: Pulmonary effort is normal.     Breath sounds: Normal breath sounds and air entry.  Abdominal:     General: Abdomen is flat. Bowel sounds are normal.     Palpations: Abdomen is soft.  Musculoskeletal:     Right lower leg: No edema.     Left lower leg: No edema.  Lymphadenopathy:     Cervical: No cervical adenopathy.  Neurological:     Mental Status: She is alert and oriented to person, place, and time. Mental status is at baseline.     Gait: Gait is intact.  Psychiatric:        Mood and Affect: Mood and affect normal.        Speech: Speech normal.        Behavior: Behavior normal.        Judgment: Judgment normal.         Assessment & Plan:    Routine Health Maintenance and Physical Exam  Immunization History  Administered Date(s) Administered   Hepatitis B, PED/ADOLESCENT 05/29/2007   Influenza Inj Mdck Quad Pf 12/14/2018   Influenza Split 11/30/2007, 11/16/2008, 10/28/2009, 01/27/2012   Influenza, Seasonal, Injecte, Preservative  Fre 10/01/2013, 12/03/2022   Influenza,trivalent, recombinat, inj, PF 11/19/2010   PPD Test 12/12/2008   Pneumococcal Conjugate-13 11/09/2009   Pneumococcal Polysaccharide-23 01/27/2012   Td 05/29/2006   Tdap 04/09/2019   Zoster Recombinant(Shingrix ) 12/03/2022, 03/04/2023    Health Maintenance  Topic Date Due   Hepatitis B Vaccines (2 of 3 - 19+ 3-dose series) 06/26/2007   Lung Cancer Screening  Never done   Pneumococcal Vaccine: 19-49 Years (3 of 3 - PCV20 or PCV21) 04/08/2017   Pneumococcal Vaccine: 50+ Years (3 of 3 - PCV20 or PCV21) 04/08/2017   INFLUENZA VACCINE  08/15/2023   MAMMOGRAM  12/23/2023   Colonoscopy  06/08/2026   DTaP/Tdap/Td (3 - Td or Tdap) 04/08/2029   Hepatitis C Screening  Completed   HIV Screening  Completed   Zoster Vaccines- Shingrix   Completed   HPV VACCINES  Aged Out   Meningococcal B Vaccine  Aged Out   COVID-19 Vaccine  Discontinued    Discussed health benefits of physical activity, and encouraged her to engage in regular exercise appropriate for her age and condition.  Primary hypertension Assessment & Plan: Pt is off her valsartan , will restart this medication and check labs to fully evaluate her HTN. See below for orders.   Orders: -     Salivary Cortisol; Future -     Salivary Cortisol; Future -     Comprehensive metabolic panel with GFR; Future -     Lipid panel; Future -     TSH; Future -     Aldosterone + renin activity w/ ratio; Future -     Valsartan ; Take 1 tablet (160 mg total) by mouth daily.  Dispense: 90 tablet; Refill: 1  Prediabetes Assessment & Plan: Needs new A1C in follow up for monitoring.   Orders: -     Hemoglobin A1c; Future  Routine general medical examination at a health care facility  Obesity (BMI 35.0-39.9 without comorbidity) Assessment & Plan: I have had an extensive 30 minute conversation today with the patient about healthy eating habits, exercise, calorie and carb goals for sustainable and successful  weight loss. I gave the patient caloric and protein daily intake values as well as described the importance  of increasing fiber and water intake. I discussed weight loss medications that could be used in the treatment of this patient. Handouts on low carb eating were given to the patient. The diet plan I gave the patient is listed below:  Total calorie intake per day: 1650  Total carb intake per day: about 90 grams or less   Total protein per ay: at least 100 grams per   Pt will also engage in an exercise program consisting of 30 minutes of cardio and 30 minutes of strength training three times per week. Patient voiced understanding that she will continue the diet and exercise plan I gave her today while she is taking the GLP medication. RTC in 3 months for weight check.  Orders: -     Wegovy ; Inject 0.25 mg into the skin once a week.  Dispense: 2 mL; Refill: 0  Vertigo -     Ambulatory referral to ENT  Normal physical exam findings except for obesity and blood pressure. I counseled the patient on the recommended amount of exercise per CDC recommendation. I reviewed preventative screening, immunizations, and medical history and updated in the chart, and appropriate labs and vaccinations were ordered. Handouts given on healthy eating and exercise.    Return in about 3 months (around 11/22/2023) for weight loss.     Heron CHRISTELLA Sharper, MD

## 2023-08-24 NOTE — Assessment & Plan Note (Signed)
 Needs new A1C in follow up for monitoring.

## 2023-08-24 NOTE — Assessment & Plan Note (Signed)
 Pt is off her valsartan , will restart this medication and check labs to fully evaluate her HTN. See below for orders.

## 2023-08-26 ENCOUNTER — Ambulatory Visit: Payer: Self-pay | Admitting: Family Medicine

## 2023-08-26 ENCOUNTER — Other Ambulatory Visit (INDEPENDENT_AMBULATORY_CARE_PROVIDER_SITE_OTHER)

## 2023-08-26 DIAGNOSIS — R7989 Other specified abnormal findings of blood chemistry: Secondary | ICD-10-CM | POA: Diagnosis not present

## 2023-08-26 DIAGNOSIS — R946 Abnormal results of thyroid function studies: Secondary | ICD-10-CM

## 2023-08-26 LAB — T4, FREE: Free T4: 0.59 ng/dL — ABNORMAL LOW (ref 0.60–1.60)

## 2023-08-27 LAB — ALDOSTERONE + RENIN ACTIVITY W/ RATIO
ALDO / PRA Ratio: 6.4 ratio (ref 0.9–28.9)
Aldosterone: 3 ng/dL
Renin Activity: 0.47 ng/mL/h (ref 0.25–5.82)

## 2023-08-28 LAB — THYROGLOBULIN ANTIBODY: Thyroglobulin Ab: <1 [IU]/mL (ref <=1)

## 2023-08-28 LAB — THYROID PEROXIDASE ANTIBODY: Thyroperoxidase Ab SerPl-aCnc: 15 [IU]/mL — ABNORMAL HIGH (ref <9)

## 2023-08-29 ENCOUNTER — Ambulatory Visit: Payer: Self-pay | Admitting: Family Medicine

## 2023-08-29 DIAGNOSIS — E039 Hypothyroidism, unspecified: Secondary | ICD-10-CM

## 2023-08-29 MED ORDER — LEVOTHYROXINE SODIUM 50 MCG PO TABS: 50.0000 ug | ORAL_TABLET | Freq: Every day | ORAL | 0 refills | Status: DC

## 2023-09-08 ENCOUNTER — Encounter: Payer: Self-pay | Admitting: Family Medicine

## 2023-09-08 DIAGNOSIS — E669 Obesity, unspecified: Secondary | ICD-10-CM

## 2023-09-09 ENCOUNTER — Other Ambulatory Visit (HOSPITAL_COMMUNITY): Payer: Self-pay

## 2023-09-09 ENCOUNTER — Telehealth: Payer: Self-pay

## 2023-09-09 NOTE — Telephone Encounter (Signed)
 Its in my note in the assessment and plan from 8/8

## 2023-09-09 NOTE — Telephone Encounter (Signed)
 Pharmacy Patient Advocate Encounter  Received notification from [employer] that Prior Authorization for Wegovy  0.25 has been DENIED.  Full denial letter will be uploaded to the media tab. See denial reason below.     PA #/Case ID/Reference #: # [id number]

## 2023-09-09 NOTE — Telephone Encounter (Signed)
 Pharmacy Patient Advocate Encounter   Received notification from Pt Calls Messages that prior authorization for Wegovy  0.25 is required/requested.   Insurance verification completed.   The patient is insured through [facility] .   Per test claim: PA required; PA submitted to above mentioned insurance via Latent Key/confirmation #/EOC [id number] Status is pending

## 2023-09-09 NOTE — Telephone Encounter (Signed)
 Documentation from 08/22/23 was submitted with PA request

## 2023-09-15 ENCOUNTER — Encounter: Payer: Self-pay | Admitting: Family Medicine

## 2023-09-16 ENCOUNTER — Other Ambulatory Visit (HOSPITAL_COMMUNITY): Payer: Self-pay

## 2023-09-22 ENCOUNTER — Other Ambulatory Visit: Payer: Self-pay | Admitting: Family Medicine

## 2023-09-22 DIAGNOSIS — E669 Obesity, unspecified: Secondary | ICD-10-CM

## 2023-09-25 ENCOUNTER — Other Ambulatory Visit: Payer: Self-pay | Admitting: Family Medicine

## 2023-09-25 MED ORDER — WEGOVY 0.25 MG/0.5ML ~~LOC~~ SOAJ: 0.2500 mg | SUBCUTANEOUS | 0 refills | Status: DC

## 2023-09-25 NOTE — Addendum Note (Signed)
 Addended by: Priscilla Finklea M on: 09/25/2023 12:57 PM   Modules accepted: Orders

## 2023-10-07 LAB — CORTISOL, SALIVARY

## 2023-10-11 LAB — SALIVARY CORTISOL X2, TIMED
Salivary Cortisol 2nd Specimen: 0.038 ug/dL
Salivary Cortisol Baseline: 0.033 ug/dL

## 2023-10-13 ENCOUNTER — Ambulatory Visit: Payer: Self-pay | Admitting: Family Medicine

## 2023-10-22 ENCOUNTER — Ambulatory Visit (INDEPENDENT_AMBULATORY_CARE_PROVIDER_SITE_OTHER): Admitting: Audiology

## 2023-10-22 ENCOUNTER — Ambulatory Visit (INDEPENDENT_AMBULATORY_CARE_PROVIDER_SITE_OTHER)

## 2023-10-22 VITALS — BP 137/83 | HR 80 | Temp 98.0°F | Ht 64.0 in | Wt 220.0 lb

## 2023-10-22 DIAGNOSIS — R42 Dizziness and giddiness: Secondary | ICD-10-CM

## 2023-10-22 DIAGNOSIS — H8111 Benign paroxysmal vertigo, right ear: Secondary | ICD-10-CM | POA: Diagnosis not present

## 2023-10-22 DIAGNOSIS — H6123 Impacted cerumen, bilateral: Secondary | ICD-10-CM

## 2023-10-22 DIAGNOSIS — E559 Vitamin D deficiency, unspecified: Secondary | ICD-10-CM | POA: Diagnosis not present

## 2023-10-22 DIAGNOSIS — R5383 Other fatigue: Secondary | ICD-10-CM | POA: Diagnosis not present

## 2023-10-22 DIAGNOSIS — R635 Abnormal weight gain: Secondary | ICD-10-CM | POA: Diagnosis not present

## 2023-10-22 DIAGNOSIS — E782 Mixed hyperlipidemia: Secondary | ICD-10-CM | POA: Diagnosis not present

## 2023-10-22 DIAGNOSIS — R7303 Prediabetes: Secondary | ICD-10-CM | POA: Diagnosis not present

## 2023-10-22 DIAGNOSIS — E039 Hypothyroidism, unspecified: Secondary | ICD-10-CM | POA: Diagnosis not present

## 2023-10-22 DIAGNOSIS — E669 Obesity, unspecified: Secondary | ICD-10-CM | POA: Diagnosis not present

## 2023-10-22 DIAGNOSIS — Z0110 Encounter for examination of ears and hearing without abnormal findings: Secondary | ICD-10-CM | POA: Diagnosis not present

## 2023-10-22 DIAGNOSIS — N951 Menopausal and female climacteric states: Secondary | ICD-10-CM | POA: Diagnosis not present

## 2023-10-22 NOTE — Progress Notes (Signed)
 Dear Dr. Ozell, Here is my assessment for our mutual patient, [name]. Thank you for allowing me the opportunity to care for your patient. Please do not hesitate to contact me should you have any other questions. Sincerely, Dr. Penne Croak  Otolaryngology Clinic Note Referring provider: Dr. Ozell HPI:  58 year old female here for dizziness.  Described as room spinning triggered by head movement such as with the head or laying down flat and turning head to the right.  For started 4 years ago.  This has been affecting her life is that she is now avoiding the dentist and getting her hair done.  She denies any ringing of the ears, ear pain, ear drainage.  She did have a history of many ear infections and tubes as a child.  She has not been vitamin D tested  Independent Review of Additional Tests or Records:  Reviewed external note from referring PCP, Michael,describing onset of vertigo. Relevant history incorporated into today's evaluation. I personally reviewed and interpreted audiogram and tympanogram. Hearing WNL bilaterally. Type A AU.   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 Eda, repeat in 7 years   VIN II (vulvar intraepithelial neoplasia II)       Past Surgical History:  Procedure Laterality Date   ABDOMINAL HYSTERECTOMY     CARPAL TUNNEL RELEASE Bilateral    11/2019   CERVICAL CONE BIOPSY  1996   Laser   CHOLECYSTECTOMY N/A 04/26/2022   Procedure: LAPAROSCOPIC CHOLECYSTECTOMY WITH ICG DYE;  Surgeon: Ebbie Cough, MD;  Location: [facility] OR;  Service: General;  Laterality: N/A;  GEN AND TAP BLOCK   COLONOSCOPY     LASER ABLATION OF THE CERVIX     MYRINGOTOMY     Bilateral, age 54 and 9   TONSILLECTOMY AND ADENOIDECTOMY     TOTAL VAGINAL HYSTERECTOMY  07/2007   TVH-menorrhagia, dysmenorrhea, still have fallopians tubes and ovaries   TUBAL LIGATION     Tubal Ligation/Steriization   VULVECTOMY N/A 09/25/2017   Procedure: PARTIAL VULVECTOMY;  Surgeon: Anitra Freddy NOVAK, MD;  Location: [facility];  Service: Gynecology;  Laterality: N/A;   WISDOM TOOTH EXTRACTION      Family History  Problem Relation Age of Onset   Heart failure Mother    Diabetes Mother    Cancer Father    COPD Maternal Grandmother    Emphysema Maternal Grandmother    Arthritis Maternal Grandmother    Lung cancer Maternal Grandfather    Breast cancer Paternal Grandmother    Tuberculosis Paternal Grandmother    Stroke Paternal Grandmother    Heart failure Paternal Grandfather    High blood  pressure Paternal Grandfather    Colon cancer Neg Hx    Colon polyps Neg Hx    Esophageal cancer Neg Hx    Rectal cancer Neg Hx    Stomach cancer Neg Hx      Social Connections: Moderately Isolated (03/04/2023)   Social Connection and Isolation Panel    Frequency of Communication with Friends and Family: Once a week    Frequency of Social Gatherings with Friends and Family: Three times a week    Attends Religious Services: Never    Active Member of Clubs or Organizations: No    Attends Engineer, structural: Not on file    Marital Status: Living with partner      Current Outpatient Medications:    albuterol  (PROVENTIL ) (2.5 MG/3ML)  0.083% nebulizer solution, Take 3 mLs (2.5 mg total) by nebulization every 6 (six) hours as needed for wheezing or shortness of breath., Disp: 150 mL, Rfl: 1   albuterol  (VENTOLIN  HFA) 108 (90 Base) MCG/ACT inhaler, Inhale 2 puffs into the lungs every 4 (four) hours as needed for wheezing or shortness of breath., Disp: 1 each, Rfl: 0   budesonide -formoterol  (SYMBICORT ) 80-4.5 MCG/ACT inhaler, Inhale 2 puffs into the lungs in the morning and at bedtime., Disp: 1 each, Rfl: 1   levothyroxine  (SYNTHROID ) 50 MCG tablet, Take 1 tablet (50 mcg total) by mouth daily., Disp: 90 tablet, Rfl: 0   omeprazole  (PRILOSEC) 40 MG capsule, Take 1 capsule (40 mg total) by mouth daily., Disp: 90 capsule, Rfl: 1   semaglutide -weight management (WEGOVY ) 0.25 MG/0.5ML SOAJ SQ injection, Inject 0.25 mg into the skin once a week., Disp: 2 mL, Rfl: 0   valsartan  (DIOVAN ) 160 MG tablet, Take 1 tablet (160 mg total) by mouth daily., Disp: 90 tablet, Rfl: 1   Physical Exam:   BP 137/83   Pulse 80   Temp 98 F (36.7 C)   Ht 5' 4 (1.626 m)   Wt 220 lb (99.8 kg)   SpO2 97%   BMI 37.76 kg/m   The patient was awake, alert, and appropriate. The external ears were inspected, and otoscopy was performed to evaluate the external auditory canals and tympanic membranes. The nasal cavity and septum were examined for mucosal changes, obstruction, or discharge. The oral cavity and oropharynx were inspected for mucosal lesions, infection, or tonsillar hypertrophy. The neck was palpated for lymphadenopathy, thyroid  abnormalities, or other masses. Cranial nerve function was grossly intact.  Pertinent Findings: Dix hallpike pos on the right Cerumen impaction on the right Tonsils absent B/l tympanosclerosis  Seprately Identifiable Procedures:  I personally ordered, reviewed and interpreted the following with the patient today  Epley Maneuver Procedure Note Template  Pre-Procedure Diagnosis: Benign Paroxysmal Positional Vertigo  (BPPV) of the right posterior semicircular canal. Post-Procedure diagnosis:  Same  Procedure: Epley Maneuver (Canalith Repositioning Procedure)  Consent: Risks, benefits, and alternatives of the Epley maneuver were discussed with the patient. The patient was informed of potential worsening of vertigo, nausea, and vomiting during the procedure. The patient demonstrated understanding and provided verbal consent to proceed.  Procedure Details:  The patient was instructed to sit upright on the examination table with legs extended.  The patient's head was turned 45 degrees to the affected size. The patient was then quickly lowered to a supine position, with the head extended off the table and supported by the provider, maintaining the 45-degree rotation. This position was held for [30-60 seconds] or until the induced vertigo and nystagmus subsided.The patient's head  was then slowly rotated 90 degrees to the opposite side, maintaining the supine position. This position was held for [30-60 seconds] or until the induced vertigo and nystagmus subsided. The patient was then rolled onto their side, maintaining the head position, so that the patient was looking toward the floor. This position was held for [30-60 seconds] or until the induced vertigo and nystagmus subsided. The patient was then slowly returned to an upright, seated position with the head tilted forward.  Post-Procedure Assessment: Subjective: Patient's symptoms improved after the procedure. Repeat testing without roomspinning  Plan: Patient education: The patient was educated on the diagnosis of BPPV and the goal of the Epley maneuver.  Post-procedure instructions: The patient was instructed to avoid provocative head movements, such as lying flat or bending over, for the rest of the day. The patient was also advised to sleep with their head elevated on two pillows for the next [24-48 hours].  Home exercises: The patient was instructed on  how to perform the Epley maneuver at home and was encouraged to do so if symptoms recur.   Coding:  CPT Code: 04007- mod 59  Procedure: Bilateral ear microscopy and cerumen removal using microscope (CPT [phone]) - Mod 25 Pre-procedure diagnosis: Cerumen impaction right external ear/ears Post-procedure diagnosis: same Indication: right cerumen impaction; given patient's otologic complaints and history as well as for improved and comprehensive examination of external ear and tympanic membrane, bilateral otologic examination using microscope was performed and impacted cerumen removed  Procedure: Patient was placed semi-recumbent. Both ear canals were examined using the microscope with findings above. Cerumen removed on left and on right using suction and currette with improvement in EAC examination and patency. Left: EAC was patent. TM was intact . Middle ear was aerated. Drainage: absent Right: EAC was patent. TM was intact. Middle ear was aerated. Drainage: absent Patient tolerated the procedure well.  Impression & Plans:  [name] is a 58 y.o. female  1. BPPV (benign paroxysmal positional vertigo), right   2. Normal hearing test of both ears   3. Dizziness and giddiness   4. Bilateral impacted cerumen    - Findings and diagnoses discussed in detail with the patient. - Risks, benefits, and alternatives were reviewed. Through shared decision making, the patient elects to proceed with below.  - Orders placed:  Orders Placed This Encounter  Procedures   Ambulatory referral to Physical Therapy  - Vitamin D deficiency is prevalent in BPPV patients and is associated with higher recurrence rates. Correction of deficiency has been shown to significantly reduce BPPV recurrence and improve physical function. - Recommend vitamin D testing and supplementation.  - Target serum 25(OH)D >= 20 ng/mL to reduce recurrence risk and optimize bone health -  If 25(OH)D < 20 ng/mL, initiate vitamin D  supplementation (e.g., [id number] IU daily, titrated to achieve target level) and consider calcium supplementation as per trial protocols - Education materials provided to the patient. - Follow up: 1 year. Patient instructed to return sooner or go to the ED if new/worsening symptoms develop.   Thank you for allowing me the opportunity to care for your patient. Please do not hesitate to contact me should you have any other questions.  Sincerely, Penne Croak, DO Otolaryngologist (ENT) [facility] Phone: [phone] Fax: [phone]  10/22/2023, 11:13 AM

## 2023-10-22 NOTE — Progress Notes (Signed)
  [address] [place], [place] [address] [phone]  Audiological Evaluation    Name: [name]     DOB:   Feb 07, 1965      MRN:   [id number]                                                                                     Service Date: 10/22/2023     Accompanied by: unaccompanied   Patient comes today after Dr. Penne Croak, DO sent a referral for a hearing evaluation due to concerns with dizziness.   Symptoms Yes Details  Hearing loss  []    Tinnitus  [x]  Sometimes in both ears, mainly right  Ear pain/ infections/pressure  []  No ear pain today  Balance problems  [x]  Vertigo for years- unknown trigger and says it is debilitating for 1-2 days. Since a couple months ago can trigger vertigo when laying down to the right- short duration. Has tried Epley at home with no success.  Noise exposure history  []    Previous ear surgeries  [x]  Reports had tubes placed in her ears twice as a child.  Family history of hearing loss  []    Amplification  []    Other  []      Otoscopy: Right ear: Clear external ear canal and notable landmarks visualized on the tympanic membrane. Left ear:  Clear external ear canal and notable landmarks visualized on the tympanic membrane.  Tympanometry: Right ear: Type Ad- Normal external ear canal volume with normal middle ear pressure and high tympanic membrane compliance. Left ear: Type Ad- Normal external ear canal volume with normal middle ear pressure and high tympanic membrane compliance.   Pure tone Audiometry: Both ears - Normal hearing from  125 Hz - 8000 Hz.  Speech Audiometry: Right ear- Speech Reception Threshold (SRT) was obtained at 15 dBHL. Left ear-Speech Reception Threshold (SRT) was obtained at 15 dBHL.   Word Recognition Score Tested using NU-6 (recorded) Right ear: 100% was obtained at a presentation level of 50 dBHL with contralateral masking which is deemed as  excellent. Left ear: 100% was obtained at a  presentation level of 50 dBHL with contralateral masking which is deemed as  excellent.   The hearing test results were completed under headphones and results are deemed to be of good reliability. Test technique:  conventional    Impression: There is not a significant difference in pure-tone thresholds between ears.There is not a significant difference in the word recognition score in between ears.    Recommendations: Follow up with ENT as scheduled for today. Recommend follow with physical therapy or a vestibular evaluation.   Return for a hearing evaluation if concerns with hearing changes arise or per MD recommendation.   Clarisse Rodriges MARIE LEROUX-MARTINEZ, AUD

## 2023-10-22 NOTE — Patient Instructions (Signed)
 Cerumen Impactions  Earwax, or cerumen,is made by the glands and the skin of the ear canal. If it is made in excess or very dry, a blockage or impaction may result. This is also common with hearing aids or frequent use of earbuds. Do not use cotton swabs a.k.a. Q-tips. Instead try the following: turn your head to one side and gently fill your canal with baby oil or mineral oil, using an eyedropper. Allow the oil to soak in for a minute or two before turning over and placing the oil in the opposite ear. Do this once or twice a day for 3 to 4 days. This will allow the wax to soften. For the next three or four days gently filled the ear canal with 3% hydrogen peroxide in the same manner that you installed the oil. Hydrogen peroxide is available at your pharmacy or market and will usually bubble out the air wax once it has become soft. If you have ventilating tube to your ears, dilute peroxide and half with water; Discontinue if you have any discomfort, dizziness or drainage. For stubborn ear impaction, it may be necessary to continue the oil and peroxide for a few weeks or have it removed by your doctor. People are frequently troubled by wax and actions may want to use the oil and peroxide on a weekly or monthly basis. If you have any further questions, do not hesitate to ask.  ? Do not use Q-tips ? Once a week use a dropper to apply 2 to 3 jobs in mineral oil to both ear canals at bedtime.

## 2023-10-29 DIAGNOSIS — E782 Mixed hyperlipidemia: Secondary | ICD-10-CM | POA: Diagnosis not present

## 2023-10-29 DIAGNOSIS — Z1339 Encounter for screening examination for other mental health and behavioral disorders: Secondary | ICD-10-CM | POA: Diagnosis not present

## 2023-10-29 DIAGNOSIS — E559 Vitamin D deficiency, unspecified: Secondary | ICD-10-CM | POA: Diagnosis not present

## 2023-10-29 DIAGNOSIS — R7303 Prediabetes: Secondary | ICD-10-CM | POA: Diagnosis not present

## 2023-10-29 DIAGNOSIS — I10 Essential (primary) hypertension: Secondary | ICD-10-CM | POA: Diagnosis not present

## 2023-10-29 DIAGNOSIS — N951 Menopausal and female climacteric states: Secondary | ICD-10-CM | POA: Diagnosis not present

## 2023-10-29 DIAGNOSIS — R232 Flushing: Secondary | ICD-10-CM | POA: Diagnosis not present

## 2023-10-29 DIAGNOSIS — E039 Hypothyroidism, unspecified: Secondary | ICD-10-CM | POA: Diagnosis not present

## 2023-10-29 DIAGNOSIS — Z1331 Encounter for screening for depression: Secondary | ICD-10-CM | POA: Diagnosis not present

## 2023-10-29 DIAGNOSIS — G479 Sleep disorder, unspecified: Secondary | ICD-10-CM | POA: Diagnosis not present

## 2023-11-03 DIAGNOSIS — R5383 Other fatigue: Secondary | ICD-10-CM | POA: Diagnosis not present

## 2023-11-03 DIAGNOSIS — Z6838 Body mass index (BMI) 38.0-38.9, adult: Secondary | ICD-10-CM | POA: Diagnosis not present

## 2023-11-03 NOTE — Therapy (Incomplete)
 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 Eda, repeat in 7 years   VIN II (vulvar intraepithelial neoplasia II)    Past Surgical History:  Procedure Laterality Date   ABDOMINAL HYSTERECTOMY     CARPAL TUNNEL RELEASE Bilateral    11/2019   CERVICAL CONE BIOPSY  1996   Laser   CHOLECYSTECTOMY N/A 04/26/2022   Procedure: LAPAROSCOPIC CHOLECYSTECTOMY WITH ICG DYE;  Surgeon: Ebbie Cough, MD;  Location: [facility] OR;  Service: General;  Laterality: N/A;  GEN AND TAP BLOCK   COLONOSCOPY     LASER ABLATION OF THE CERVIX     MYRINGOTOMY     Bilateral, age 34 and 9   TONSILLECTOMY AND ADENOIDECTOMY     TOTAL VAGINAL HYSTERECTOMY  07/2007   TVH-menorrhagia, dysmenorrhea, still have fallopians tubes and ovaries   TUBAL LIGATION     Tubal Ligation/Steriization   VULVECTOMY N/A 09/25/2017   Procedure: PARTIAL VULVECTOMY;  Surgeon: Anitra Freddy NOVAK, MD;  Location: [facility] [place];  Service: Gynecology;  Laterality: N/A;   WISDOM TOOTH EXTRACTION     Patient Active Problem List   Diagnosis Date Noted   Depression,  major, single episode, moderate (HCC) 03/05/2023   Headache with neurologic deficit 03/05/2023   Obstructive sleep apnea 03/04/2023   Prediabetes 12/03/2022   Primary hypertension 07/29/2022   Urge incontinence 07/29/2022   Abdominal pain, epigastric 05/25/2021   Nausea and vomiting 05/25/2021   Loose stools 05/25/2021   Menopausal symptoms 07/05/2020   Facet arthritis of cervical region 09/09/2019   Tubular adenoma of colon 06/16/2019   Urinary incontinence 04/09/2019   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   Bilateral carpal tunnel syndrome 06/24/2017   Primary osteoarthritis of first carpometacarpal joint of right hand 06/24/2017   Chronic low back pain 06/23/2012   Chronic bronchitis (HCC) 11/19/2010   Drug addiction in remission (HCC) 11/19/2010   Common migraine 03/23/2010   GERD (gastroesophageal reflux disease) 03/12/2010   Obesity (BMI 35.0-39.9 without comorbidity) 03/12/2010   Tobacco use disorder 12/11/2009   Major depression, recurrent 03/03/2009    PCP: Ozell Heron HERO, MD REFERRING PROVIDER: Anice Riis, DO  REFERRING DIAG: H81.11 (ICD-10-CM) - BPPV (benign paroxysmal positional vertigo), right  THERAPY DIAG:  No diagnosis found.  ONSET DATE: 10/22/2023  Rationale for Evaluation and Treatment: {HABREHAB:27488}  SUBJECTIVE:   SUBJECTIVE STATEMENT: *** Pt accompanied by: {accompnied:27141}  PERTINENT HISTORY: PMH: HTN, migraines, depression   Described as room spinning triggered by head movement  such as with the head or laying down flat and turning head to the right. For started 4 years ago. This has been affecting her life is that she is now avoiding the dentist and getting her hair done.   Treated with DixHallpike at ENT on 10/22/23  PAIN:  Are you having pain? {OPRCPAIN:27236}  PRECAUTIONS: {Therapy precautions:24002}  RED FLAGS: {PT Red Flags:29287}   WEIGHT BEARING RESTRICTIONS: {Yes ***/No:24003}  FALLS: Has  patient fallen in last 6 months? {fallsyesno:27318}  LIVING ENVIRONMENT: Lives with: {OPRC lives with:25569::lives with their family} Lives in: {Lives in:25570} Stairs: {opstairs:27293} Has following equipment at home: {Assistive devices:23999}  PLOF: {PLOF:24004}  PATIENT GOALS: ***  OBJECTIVE:  Note: Objective measures were completed at Evaluation unless otherwise noted.  DIAGNOSTIC FINDINGS: ***  COGNITION: Overall cognitive status: {cognition:24006}   SENSATION: {sensation:27233}  EDEMA:  {edema:24020}  MUSCLE TONE:  {LE tone:25568}  DTRs:  {DTR SITE:24025}  POSTURE:  {posture:25561}  Cervical ROM:    {AROM/PROM:27142} A/PROM (deg) eval  Flexion   Extension   Right lateral flexion   Left lateral flexion   Right rotation   Left rotation   (Blank rows = not tested)  STRENGTH: ***  LOWER EXTREMITY MMT:   MMT Right eval Left eval  Hip flexion    Hip abduction    Hip adduction    Hip internal rotation    Hip external rotation    Knee flexion    Knee extension    Ankle dorsiflexion    Ankle plantarflexion    Ankle inversion    Ankle eversion    (Blank rows = not tested)  BED MOBILITY:  {Bed mobility:24027}  TRANSFERS: Assistive device utilized: {Assistive devices:23999}  Sit to stand: {Levels of assistance:24026} Stand to sit: {Levels of assistance:24026} Chair to chair: {Levels of assistance:24026} Floor: {Levels of assistance:24026}  RAMP: {Levels of assistance:24026}  CURB: {Levels of assistance:24026}  GAIT: Gait pattern: {gait characteristics:25376} Distance walked: *** Assistive device utilized: {Assistive devices:23999} Level of assistance: {Levels of assistance:24026} Comments: ***  FUNCTIONAL TESTS:  {Functional tests:24029}  PATIENT SURVEYS:  {rehab surveys:24030}  VESTIBULAR ASSESSMENT:  GENERAL OBSERVATION: ***   SYMPTOM BEHAVIOR:  Subjective history: ***  Non-Vestibular symptoms: {nonvestibular  symptoms:25260}  Type of dizziness: {Type of Dizziness:25255}  Frequency: ***  Duration: ***  Aggravating factors: {Aggravating Factors:25258}  Relieving factors: {Relieving Factors:25259}  Progression of symptoms: {DESC; BETTER/WORSE:18575}  OCULOMOTOR EXAM:  Ocular Alignment: {Ocular Alignment:25262}  Ocular ROM: {RANGE OF MOTION:21649}  Spontaneous Nystagmus: {Spontaneous nystagmus:25263}  Gaze-Induced Nystagmus: {gaze-induced nystagmus:25264}  Smooth Pursuits: {smooth pursuit:25265}  Saccades: {saccades:25266}  Convergence/Divergence: *** cm   Cover-cross-cover test: {cover test:33756}  FRENZEL - FIXATION SUPRESSED:  Ocular Alignment: {Ocular Alignment:25262}  Spontaneous Nystagmus: {Spontaneous nystagmus:25263}  Gaze-Induced Nystagmus: {gaze-induced nystagmus:25264}  Horizontal head shaking - induced nystagmus: {head shaking induced nystagmus:25267}  Vertical head shaking - induced nystagmus: {head shaking induced nystagmus:25267}  Positional tests: {Positional tests:25271}  Pressure tests: {frenzel pressure tests:25268}  VESTIBULAR - OCULAR REFLEX:   Slow VOR: {slow VOR:25290}  VOR Cancellation: {vor cancellation:25291}  Head-Impulse Test: {head impulse test:25272}  Dynamic Visual Acuity: {dynamic visual acuity:25273}   POSITIONAL TESTING: {Positional tests:25271}  MOTION SENSITIVITY:  Motion Sensitivity Quotient Intensity: 0 = none, 1 = Lightheaded, 2 = Mild, 3 = Moderate, 4 = Severe, 5 = Vomiting  Intensity  1. Sitting to supine   2. Supine to L side   3. Supine to R side   4. Supine to sitting   5. L Hallpike-Dix   6. Up  from L    7. R Hallpike-Dix   8. Up from R    9. Sitting, head tipped to L knee   10. Head up from L knee   11. Sitting, head tipped to R knee   12. Head up from R knee   13. Sitting head turns x5   14.Sitting head nods x5   15. In stance, 180 turn to L    16. In stance, 180 turn to R     OTHOSTATICS: {Exam;  orthostatics:31331}  FUNCTIONAL GAIT: {Functional tests:24029}                                                                                                                             TREATMENT DATE: ***   Canalith Repositioning:  {Canalith Repositioning:25283} Gaze Adaptation:  {gaze adaptation:25286} Habituation:  {habituation:25288} Other: ***  PATIENT EDUCATION: Education details: *** Person educated: {Person educated:25204} Education method: {Education Method:25205} Education comprehension: {Education Comprehension:25206}  HOME EXERCISE PROGRAM:  GOALS: Goals reviewed with patient? {yes/no:20286}  SHORT TERM GOALS: Target date: ***  *** Baseline: Goal status: {GOALSTATUS:25110}  2.  *** Baseline:  Goal status: {GOALSTATUS:25110}  3.  *** Baseline:  Goal status: {GOALSTATUS:25110}  4.  *** Baseline:  Goal status: {GOALSTATUS:25110}  5.  *** Baseline:  Goal status: {GOALSTATUS:25110}  6.  *** Baseline:  Goal status: {GOALSTATUS:25110}  LONG TERM GOALS: Target date: ***  *** Baseline:  Goal status: {GOALSTATUS:25110}  2.  *** Baseline:  Goal status: {GOALSTATUS:25110}  3.  *** Baseline:  Goal status: {GOALSTATUS:25110}  4.  *** Baseline:  Goal status: {GOALSTATUS:25110}  5.  *** Baseline:  Goal status: {GOALSTATUS:25110}  6.  *** Baseline:  Goal status: {GOALSTATUS:25110}  ASSESSMENT:  CLINICAL IMPRESSION: Patient is a *** y.o. *** who was seen today for physical therapy evaluation and treatment for ***.   OBJECTIVE IMPAIRMENTS: {opptimpairments:25111}.   ACTIVITY LIMITATIONS: {activitylimitations:27494}  PARTICIPATION LIMITATIONS: {participationrestrictions:25113}  PERSONAL FACTORS: {Personal factors:25162} are also affecting patient's functional outcome.   REHAB POTENTIAL: {rehabpotential:25112}  CLINICAL DECISION MAKING: {clinical decision making:25114}  EVALUATION COMPLEXITY: {Evaluation  complexity:25115}   PLAN:  PT FREQUENCY: {rehab frequency:25116}  PT DURATION: {rehab duration:25117}  PLANNED INTERVENTIONS: {rehab planned interventions:25118::97110-Therapeutic exercises,97530- Therapeutic [id number]- Neuromuscular re-education,97535- Self Rjmz,02859- Manual therapy,Patient/Family education}  PLAN FOR NEXT SESSION: ***   [place] LOISE Senate, PT, DPT 11/03/2023, 12:15 PM

## 2023-11-04 ENCOUNTER — Ambulatory Visit: Admitting: Physical Therapy

## 2023-11-12 DIAGNOSIS — I10 Essential (primary) hypertension: Secondary | ICD-10-CM | POA: Diagnosis not present

## 2023-11-12 DIAGNOSIS — N951 Menopausal and female climacteric states: Secondary | ICD-10-CM | POA: Diagnosis not present

## 2023-11-12 DIAGNOSIS — E669 Obesity, unspecified: Secondary | ICD-10-CM | POA: Diagnosis not present

## 2023-11-12 DIAGNOSIS — Z6837 Body mass index (BMI) 37.0-37.9, adult: Secondary | ICD-10-CM | POA: Diagnosis not present

## 2023-11-14 ENCOUNTER — Ambulatory Visit: Admitting: Physical Therapy

## 2023-11-14 DIAGNOSIS — R2681 Unsteadiness on feet: Secondary | ICD-10-CM

## 2023-11-14 DIAGNOSIS — R42 Dizziness and giddiness: Secondary | ICD-10-CM | POA: Diagnosis present

## 2023-11-14 DIAGNOSIS — H8111 Benign paroxysmal vertigo, right ear: Secondary | ICD-10-CM | POA: Diagnosis not present

## 2023-11-14 DIAGNOSIS — H8112 Benign paroxysmal vertigo, left ear: Secondary | ICD-10-CM | POA: Insufficient documentation

## 2023-11-14 NOTE — Progress Notes (Signed)
 OUTPATIENT PHYSICAL THERAPY VESTIBULAR EVALUATION     Patient Name: [name] MRN: [id number] DOB:[birth year], 58 y.o., female Today's Date: 11/18/2023  END OF SESSION:   11/14/23 1123  PT Visits / Re-Eval  Visit Number 1  Number of Visits 2  Date for Recertification  12/14/23  Authorization  Authorization Type [place] MEDICAID [employer]  PT Time Calculation  PT Start Time 0847  PT Stop Time 0929  PT Time Calculation (min) 42 min  PT - End of Session  Equipment Utilized During Treatment Gait belt  Activity Tolerance Patient tolerated treatment well  Behavior During Therapy WFL for tasks assessed/performed     Past Medical History:  Diagnosis Date   Anxiety    Arthritis of carpometacarpal (CMC) joint of right thumb    Bilateral carpal tunnel syndrome    Breast cyst, right 12/18/2007   3 mm simple cyst at the 11 o'clock position   Cervical dysplasia    COPD (chronic obstructive pulmonary disease) (HCC)    remote history of early symptoms   Drug addiction (HCC)    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 Eda, repeat in 7 years   VIN II (vulvar intraepithelial neoplasia II)    Past Surgical History:  Procedure Laterality Date   ABDOMINAL HYSTERECTOMY     CARPAL TUNNEL RELEASE Bilateral    11/2019   CERVICAL CONE BIOPSY  1996   Laser   CHOLECYSTECTOMY N/A 04/26/2022   Procedure: LAPAROSCOPIC CHOLECYSTECTOMY WITH ICG DYE;  Surgeon: Ebbie Cough, MD;  Location: [facility] OR;  Service: General;  Laterality: N/A;  GEN AND TAP BLOCK   COLONOSCOPY     LASER ABLATION OF THE CERVIX     MYRINGOTOMY     Bilateral, age 58 and 9    TONSILLECTOMY AND ADENOIDECTOMY     TOTAL VAGINAL HYSTERECTOMY  07/2007   TVH-menorrhagia, dysmenorrhea, still have fallopians tubes and ovaries   TUBAL LIGATION     Tubal Ligation/Steriization   VULVECTOMY N/A 09/25/2017   Procedure: PARTIAL VULVECTOMY;  Surgeon: Anitra Freddy NOVAK, MD;  Location: [facility] [place];  Service: Gynecology;  Laterality: N/A;   WISDOM TOOTH EXTRACTION     Patient Active Problem List   Diagnosis Date Noted   Depression, major, single episode, moderate (HCC) 03/05/2023   Headache with neurologic deficit 03/05/2023   Obstructive sleep apnea 03/04/2023   Prediabetes 12/03/2022   Primary hypertension 07/29/2022   Urge incontinence 07/29/2022   Abdominal pain, epigastric 05/25/2021   Nausea and vomiting 05/25/2021   Loose stools 05/25/2021   Menopausal symptoms 07/05/2020   Facet arthritis of cervical region 09/09/2019   Tubular adenoma of colon 06/16/2019   Urinary incontinence 04/09/2019   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   Bilateral carpal tunnel syndrome 06/24/2017   Primary osteoarthritis of first carpometacarpal joint of right hand 06/24/2017   Chronic low back pain 06/23/2012   Chronic bronchitis (HCC) 11/19/2010   Drug addiction in remission (HCC) 11/19/2010   Common migraine 03/23/2010   GERD (gastroesophageal reflux disease) 03/12/2010   Obesity (BMI 35.0-39.9 without comorbidity)  03/12/2010   Tobacco use disorder 12/11/2009   Major depression, recurrent 03/03/2009    PCP: Ozell Heron HERO, MD REFERRING PROVIDER: Anice Riis, DO  REFERRING DIAG: BPPV (benign paroxysmal positional vertigo), right  THERAPY DIAG:  Dizziness and giddiness - Plan: PT plan of care cert/re-cert  BPPV (benign paroxysmal positional vertigo), left  ONSET DATE: 10/22/2023  Rationale for Evaluation and Treatment: Rehabilitation  SUBJECTIVE:   SUBJECTIVE STATEMENT: Patient states they had vertigo about 4 years and that  the first episode was debilitating. They stated the room was spinning and they were very nauseated which lasted about 2 days before going to the emergency room. At that time it took a lot of self eplys at home to help it go away but it eventually went away after a few days. For this recent episode - At about the end of August or Sept. she had the most recent episode. She stated if she laid down that the room would spin but she would sit up and wait a few minutes and it would go away. But every time she would lay down she would have this room spinning. She said she tries to avoid movements that might make it happen. She went to the ENT and they did 3 epleys and made it better and she hasn't had this spinning since going there but still tries not to move her head a lot because she is afraid it will come back.  Pt accompanied by: self  PERTINENT HISTORY: Patient is a 58 YO female who has a PMH of COPD, chronic bronchitis, GERD, chronic low back pain, common migraine, headache with neurologic deficit, major depression and obesity.  PAIN:  Are you having pain? No  FALLS: Has patient fallen in last 6 months? No  LIVING ENVIRONMENT: Lives with: lives with their spouse Lives in: Mobile home Stairs: 6 steps  PLOF: Independent  PATIENT GOALS: Wants to get some exercises so that the dizziness doesn't come back.  OBJECTIVE:  Note: Objective measures were completed at Evaluation unless otherwise noted.  DIAGNOSTIC FINDINGS:  03/27/2023, CT Head WO Contrast: IMPRESSION: The foramen magnum and right cerebellar tonsil are incompletely included in the field of view. Within this limitation, unremarkable non-contrast CT appearance of the brain. No evidence of an acute intracranial abnormality.  COGNITION: Overall cognitive status: Within functional limits for tasks assessed   Cervical ROM:    Cervical AROM: WFL for B rotation   GAIT: Gait pattern: WFL Distance walked: Clinic distance Assistive  device utilized: None Level of assistance: Complete Independence   VESTIBULAR ASSESSMENT:  GENERAL OBSERVATION: Patient ambulates into clinic without AD.   SYMPTOM BEHAVIOR:  Subjective history: See above  Non-Vestibular symptoms: ear ringing both ears, more in L, constant nausea with stomach issues  Type of dizziness: Blurred Vision, Spinning/Vertigo, and World moves  Frequency: every day  Duration: couple of minutes  Aggravating factors: Induced by position change: lying supine and rolling to the right and Induced by motion: looking up at the ceiling and bending down to the ground  Relieving factors: head stationary, rest, and slow movements  Progression of symptoms: better  OCULOMOTOR EXAM:  Ocular Alignment: normal  Ocular ROM: No Limitations  Spontaneous Nystagmus: absent  Gaze-Induced Nystagmus: absent  Smooth Pursuits: intact  Saccades: intact  Convergence/Divergence: 5-6 cm   Cover-cross-cover test: Normal  VESTIBULAR - OCULAR REFLEX:   Slow VOR: Normal  VOR Cancellation: Normal  Head-Impulse Test: HIT Right: negative HIT Left: negative   POSITIONAL TESTING: Right Dix-Hallpike:  no nystagmus and stomach issue/nausea Left Dix-Hallpike: upward left torsional nystagmus with room spinning, nystagmus fatigued in about 10-15 seconds and nausea subsided  MOTION SENSITIVITY:  Motion Sensitivity Quotient Intensity: 0 = none, 1 = Lightheaded, 2 = Mild, 3 = Moderate, 4 = Severe, 5 = Vomiting  Intensity  1. Sitting to supine   2. Supine to L side   3. Supine to R side   4. Supine to sitting   5. L Hallpike-Dix 2-3  6. Up from L  1  7. R Hallpike-Dix 1  8. Up from R  0  9. Sitting, head tipped to L knee   10. Head up from L knee   11. Sitting, head tipped to R knee   12. Head up from R knee   13. Sitting head turns x5   14.Sitting head nods x5   15. In stance, 180 turn to L    16. In stance, 180 turn to R                                                                                                                                 TREATMENT DATE: 11/14/2023  Canalith Repositioning:  Epley Left: Number of Reps: 1, Response to Treatment: symptoms improved, and Comment: Repositioning indicated after positive for left posterior canalithiasis, only nystagmus and nausea noted in first position but no other positions. No signs or symptoms of distress.  PATIENT EDUCATION: Education details: Patient educated on findings during assessment today - normal findings with oculomotor assessment. Educated on left dixhall pike positive for left posterior canalithiasis and treatment with left eply. Patient advised to stop self epley at home at this time. Person educated: Patient Education method: Explanation Education comprehension: verbalized understanding  HOME EXERCISE PROGRAM:  GOALS: Goals reviewed with patient? Yes  STG = LTG   LONG TERM GOALS: Target date: 12/14/2023  Pt will be independent with final HEP in order to improve symptoms. Baseline: to be updated  Goal status: INITIAL   2.  Patient will demonstrate (-) positional testing to indicate resolution of BPPV.   Baseline: positive L posterior canal canalithiasis  Goal status: INITAL   3.  MSQ will be establish as LTG. Baseline: TBA Goal status: TBA  ASSESSMENT:  CLINICAL IMPRESSION: Patient is a 58 y.o. female who was seen today in Neuro OPPT for BPPV. She had episodes of spinning a few months back that continued to return with specific head movement. It wasn't until going to the ENT where they did a few epley maneuvers that the room spinning subsided. Assessment revealed left posterior canalithiasis with left upward torsional nystagmus and room spinning. BPPV was treated using L eply maneuver. Positions will continued to be assessed at next visit. Patient continues to benefit from skilled physical therapy in order to decrease symptoms of BPPV including room spinning dizziness and feeling  unsteady. Will continue per POC.   OBJECTIVE IMPAIRMENTS: decreased balance and dizziness.  ACTIVITY LIMITATIONS: lifting, bending, bathing, dressing, and any movement involving head movement.  PARTICIPATION LIMITATIONS: meal prep, cleaning, laundry, and driving  PERSONAL FACTORS: Age, Sex, and 1-2 comorbidities: COPD, chronic bronchitis, GERD, chronic low back pain, common migraine, headache with neurologic deficit, major depression and obesity. are also affecting patient's functional outcome.   REHAB POTENTIAL: Good  CLINICAL DECISION MAKING: Stable/uncomplicated  EVALUATION COMPLEXITY: Low   PLAN:  PT FREQUENCY: 1-2x/week  PT DURATION: 4 weeks  PLANNED INTERVENTIONS: 97164- PT Re-evaluation, 97110-Therapeutic exercises, 97530- Therapeutic activity, 97112- Neuromuscular re-education, 97535- Self Care, [id number]- Manual therapy, [phone]- Gait training, [phone]- Canalith repositioning, Patient/Family education, Balance training, Stair training, Vestibular training, Visual/preceptual remediation/compensation, Cognitive remediation, and DME instructions  PLAN FOR NEXT SESSION: Assess canals, assess L dix hall pike, complete MSQ   Emmalene Sherry, Student-PT 11/18/2023, 7:55 AM

## 2023-11-19 DIAGNOSIS — E039 Hypothyroidism, unspecified: Secondary | ICD-10-CM | POA: Diagnosis not present

## 2023-11-19 DIAGNOSIS — Z6837 Body mass index (BMI) 37.0-37.9, adult: Secondary | ICD-10-CM | POA: Diagnosis not present

## 2023-11-19 DIAGNOSIS — E669 Obesity, unspecified: Secondary | ICD-10-CM | POA: Diagnosis not present

## 2023-11-21 ENCOUNTER — Ambulatory Visit: Admitting: Family Medicine

## 2023-11-24 ENCOUNTER — Ambulatory Visit: Admitting: Family Medicine

## 2023-11-26 DIAGNOSIS — E039 Hypothyroidism, unspecified: Secondary | ICD-10-CM | POA: Diagnosis not present

## 2023-11-26 DIAGNOSIS — M2550 Pain in unspecified joint: Secondary | ICD-10-CM | POA: Diagnosis not present

## 2023-11-26 DIAGNOSIS — E669 Obesity, unspecified: Secondary | ICD-10-CM | POA: Diagnosis not present

## 2023-11-26 DIAGNOSIS — N951 Menopausal and female climacteric states: Secondary | ICD-10-CM | POA: Diagnosis not present

## 2023-11-28 ENCOUNTER — Ambulatory Visit: Payer: Self-pay | Admitting: Physical Therapy

## 2023-12-03 ENCOUNTER — Ambulatory Visit: Admitting: Family Medicine

## 2023-12-10 ENCOUNTER — Encounter: Payer: Self-pay | Admitting: Family Medicine

## 2023-12-10 ENCOUNTER — Ambulatory Visit (INDEPENDENT_AMBULATORY_CARE_PROVIDER_SITE_OTHER): Admitting: Family Medicine

## 2023-12-10 VITALS — BP 110/80 | HR 80 | Temp 97.4°F | Ht 64.0 in | Wt 213.2 lb

## 2023-12-10 DIAGNOSIS — F17210 Nicotine dependence, cigarettes, uncomplicated: Secondary | ICD-10-CM | POA: Diagnosis not present

## 2023-12-10 DIAGNOSIS — Z23 Encounter for immunization: Secondary | ICD-10-CM

## 2023-12-10 DIAGNOSIS — I10 Essential (primary) hypertension: Secondary | ICD-10-CM | POA: Diagnosis not present

## 2023-12-10 DIAGNOSIS — E039 Hypothyroidism, unspecified: Secondary | ICD-10-CM | POA: Diagnosis not present

## 2023-12-10 DIAGNOSIS — Z6836 Body mass index (BMI) 36.0-36.9, adult: Secondary | ICD-10-CM | POA: Diagnosis not present

## 2023-12-10 DIAGNOSIS — R110 Nausea: Secondary | ICD-10-CM

## 2023-12-10 MED ORDER — LEVOTHYROXINE SODIUM 50 MCG PO TABS: 50.0000 ug | ORAL_TABLET | Freq: Every day | ORAL | 1 refills | Status: AC

## 2023-12-10 MED ORDER — ONDANSETRON HCL 4 MG PO TABS: 4.0000 mg | ORAL_TABLET | Freq: Three times a day (TID) | ORAL | 3 refills | Status: AC | PRN

## 2023-12-10 NOTE — Progress Notes (Signed)
 Established Patient Office Visit  Subjective   Patient ID: [name], female    DOB: 01/30/1965  Age: 58 y.o. MRN: [id number]  Chief Complaint  Patient presents with   Medical Management of Chronic Issues    HPI Discussed the use of AI scribe software for clinical note transcription with the patient, who gave verbal consent to proceed.  History of Present Illness   [name] is a 58 year old female with hypertension and hypothyroidism who presents for a follow-up visit to monitor her blood pressure and thyroid  function.  She has hypertension treated with valsartan  and hypothyroidism treated with thyroid  medication started in August 2025. It has been more than six to eight weeks since starting thyroid  medication, and she feels better overall.  She uses hormone replacement therapy with testosterone, estrogen, and progesterone, and receives B12 and MIC injections. She notes improved sleep and reduced hot flashes with these treatments.  She follows a gluten-free diet and avoids fast food, soda, and salty snacks after being diagnosed with gluten intolerance. She feels better with these dietary changes.  She has chronic nausea treated with an oral dissolving medication. She takes semaglutide , which can cause nausea, although her nausea started before semaglutide .  She has vitamin D deficiency and takes vitamin D3 with K2 supplements. Past vertigo symptoms resolved with treatment.  She smokes but is interested in quitting and is open to nicotine replacement options such as patches or lozenges.       Current Outpatient Medications  Medication Instructions   albuterol  (PROVENTIL ) 2.5 mg, Nebulization, Every 6 hours PRN   albuterol  (VENTOLIN  HFA) 108 (90 Base) MCG/ACT inhaler 2 puffs, Inhalation, Every 4 hours PRN   budesonide -formoterol  (SYMBICORT ) 80-4.5 MCG/ACT inhaler 2 puffs, Inhalation, 2 times daily   levothyroxine  (SYNTHROID ) 50 mcg, Oral, Daily   omeprazole   (PRILOSEC) 40 mg, Oral, Daily   ondansetron  (ZOFRAN ) 4 mg, Oral, Every 8 hours PRN   SEMAGLUTIDE  [place] 0.5 mg, Weekly   valsartan  (DIOVAN ) 160 mg, Oral, Daily    Patient Active Problem List   Diagnosis Date Noted   Depression, major, single episode, moderate (HCC) 03/05/2023   Headache with neurologic deficit 03/05/2023   Obstructive sleep apnea 03/04/2023   Prediabetes 12/03/2022   Primary hypertension 07/29/2022   Urge incontinence 07/29/2022   Abdominal pain, epigastric 05/25/2021   Nausea and vomiting 05/25/2021   Loose stools 05/25/2021   Menopausal symptoms 07/05/2020   Facet arthritis of cervical region 09/09/2019   Tubular adenoma of colon 06/16/2019   Urinary incontinence 04/09/2019   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   Bilateral carpal tunnel syndrome 06/24/2017   Primary osteoarthritis of first carpometacarpal joint of right hand 06/24/2017   Chronic low back pain 06/23/2012   Chronic bronchitis (HCC) 11/19/2010   Drug addiction in remission (HCC) 11/19/2010   Common migraine 03/23/2010   GERD (gastroesophageal reflux disease) 03/12/2010   Obesity (BMI 35.0-39.9 without comorbidity) 03/12/2010   Tobacco use disorder 12/11/2009   Major depression, recurrent 03/03/2009     Review of Systems  All other systems reviewed and are negative.     Objective:     BP 110/80   Pulse 80   Temp (!) 97.4 F (36.3 C) (Axillary)   Ht 5' 4 (1.626 m)   Wt 213 lb 3.2 oz (96.7 kg)   SpO2 97%   BMI 36.60 kg/m    Physical Exam Vitals reviewed.  Constitutional:      Appearance: Normal appearance. She  is well-groomed. She is obese.  Cardiovascular:     Rate and Rhythm: Normal rate and regular rhythm.     Heart sounds: S1 normal and S2 normal.  Pulmonary:     Effort: Pulmonary effort is normal.     Breath sounds: Normal breath sounds and air entry.  Musculoskeletal:     Right lower leg: No edema.     Left lower leg: No edema.  Neurological:      Mental Status: She is alert and oriented to person, place, and time. Mental status is at baseline.     Gait: Gait is intact.  Psychiatric:        Mood and Affect: Mood and affect normal.        Speech: Speech normal.        Behavior: Behavior normal.        Judgment: Judgment normal.      No results found for any visits on 12/10/23.    The 10-year ASCVD risk score (Arnett DK, et al., 2019) is: 5.8%    Assessment & Plan:  Immunization due -     Flu vaccine trivalent PF, 6mos and older(Flulaval,Afluria,Fluarix,Fluzone)  Hypothyroidism (acquired) -     Levothyroxine  Sodium; Take 1 tablet (50 mcg total) by mouth daily.  Dispense: 90 tablet; Refill: 1  Primary hypertension  Cigarette smoker -     CT CHEST LUNG CANCER SCREENING LOW DOSE WO CONTRAST; Future  Nausea -     Ondansetron  HCl; Take 1 tablet (4 mg total) by mouth every 8 (eight) hours as needed for nausea or vomiting.  Dispense: 20 tablet; Refill: 3   Assessment and Plan    Essential hypertension Blood pressure is well-controlled at 110/80 mmHg with current medication regimen. - Continue current antihypertensive medication regimen.  Hypothyroidism Managed with thyroid  medication since August. Recent labs indicate stable thyroid  function. - Continue current thyroid  medication regimen.  Menopausal hormone therapy in a patient with nicotine dependence On hormone replacement therapy with testosterone, estrogen, and progesterone, is under the care of [facility] for this as well as the semaglutide  injctions for weight loss. Smoking increases risk of blood clots with estrogen therapy. Discussed risks of smoking with estrogen therapy, including increased risk of blood clots and stroke. Discussed smoking cessation options, including nicotine replacement therapy and Zyn lozenges. - Continue hormone replacement therapy with caution due to smoking history. - Discussed smoking cessation options, including nicotine replacement  therapy and Zyn lozenges. -- ordering LDCT of chest for lung cancer screening per the guidelines.   Obesity on semaglutide  therapy On semaglutide  for weight loss. Reports nausea as a side effect. Discussed potential benefits of semaglutide  in reducing cigarette cravings. - Refilled nausea medication.  Nausea associated with semaglutide  therapy Nausea is a known side effect of semaglutide . Reports improvement in symptoms with current management. She was having nausea prior to starting the semaglutide  and was wondering about her cardiac risk. I counseled the patient on further testing including CT calcium score to better calculate her risk. Right now her overall cardiac risk is still low but she is a smoker with HTN and obesity so we will need to monitor this carefully.  - Continue current management for nausea.  Gluten intolerance Diagnosed with gluten intolerance. Reports improvement in gastrointestinal symptoms after adopting a gluten-free diet. - Continue gluten-free diet.  Vitamin D deficiency Managed with supplementation. Recent labs indicate low vitamin D levels. - Continue vitamin D supplementation.  Encounter for immunization Received flu shot during this visit.  Return in about 6 months (around 06/08/2024).    Heron CHRISTELLA Sharper, MD

## 2023-12-17 DIAGNOSIS — I10 Essential (primary) hypertension: Secondary | ICD-10-CM | POA: Diagnosis not present

## 2023-12-17 DIAGNOSIS — Z6836 Body mass index (BMI) 36.0-36.9, adult: Secondary | ICD-10-CM | POA: Diagnosis not present

## 2023-12-18 ENCOUNTER — Ambulatory Visit
Admission: RE | Admit: 2023-12-18 | Discharge: 2023-12-18 | Disposition: A | Source: Ambulatory Visit | Attending: Family Medicine

## 2023-12-18 DIAGNOSIS — F17210 Nicotine dependence, cigarettes, uncomplicated: Secondary | ICD-10-CM

## 2023-12-21 ENCOUNTER — Other Ambulatory Visit: Payer: Self-pay | Admitting: Family Medicine

## 2023-12-21 DIAGNOSIS — K219 Gastro-esophageal reflux disease without esophagitis: Secondary | ICD-10-CM

## 2023-12-24 DIAGNOSIS — K219 Gastro-esophageal reflux disease without esophagitis: Secondary | ICD-10-CM | POA: Diagnosis not present

## 2023-12-24 DIAGNOSIS — Z1231 Encounter for screening mammogram for malignant neoplasm of breast: Secondary | ICD-10-CM | POA: Diagnosis not present

## 2023-12-24 DIAGNOSIS — I10 Essential (primary) hypertension: Secondary | ICD-10-CM | POA: Diagnosis not present

## 2023-12-24 DIAGNOSIS — Z6836 Body mass index (BMI) 36.0-36.9, adult: Secondary | ICD-10-CM | POA: Diagnosis not present

## 2023-12-24 LAB — HM MAMMOGRAPHY

## 2023-12-25 ENCOUNTER — Encounter: Payer: Self-pay | Admitting: Family Medicine

## 2023-12-25 ENCOUNTER — Ambulatory Visit: Payer: Self-pay | Admitting: Family Medicine

## 2023-12-30 ENCOUNTER — Ambulatory Visit: Payer: Self-pay | Admitting: Family Medicine

## 2023-12-30 DIAGNOSIS — E782 Mixed hyperlipidemia: Secondary | ICD-10-CM

## 2023-12-31 DIAGNOSIS — Z6836 Body mass index (BMI) 36.0-36.9, adult: Secondary | ICD-10-CM | POA: Diagnosis not present

## 2023-12-31 DIAGNOSIS — M2550 Pain in unspecified joint: Secondary | ICD-10-CM | POA: Diagnosis not present

## 2024-01-16 MED ORDER — ROSUVASTATIN CALCIUM 10 MG PO TABS: 10.0000 mg | ORAL_TABLET | Freq: Every day | ORAL | 3 refills | Status: DC

## 2024-01-19 ENCOUNTER — Ambulatory Visit: Payer: Self-pay

## 2024-01-19 NOTE — Telephone Encounter (Signed)
 Noted- ok to close.

## 2024-01-19 NOTE — Telephone Encounter (Signed)
 FYI Only or Action Required?: FYI only for provider: ED advised.  Patient was last seen in primary care on 12/10/2023 by Angela Heron HERO, MD.  Called Nurse Triage reporting Chest Pain.  Symptoms began about a month ago.  Interventions attempted: Nothing.  Symptoms are: unchanged.  Triage Disposition: Go to ED Now (Notify PCP)  Patient/caregiver understands and will follow disposition?: Yes   Copied from CRM [id number]. Topic: Clinical - Red Word Triage >> Jan 19, 2024  2:26 PM Delon DASEN wrote: Red Word that prompted transfer to Nurse Triage: heart palpitations/fluttering and nausea, occasional chest pain and dizziness Reason for Disposition  Pain also in shoulder(s) or arm(s) or jaw  (Exception: Pain is clearly made worse by movement.)  Answer Assessment - Initial Assessment Questions Advised ED now and 911 if symptoms worsen. Patient verbalized understanding.  Previously scheduled for 01/23/24. 1. LOCATION: Where does it hurt?     Constant right  Neck pain and right shoulder, nausea denies HA, dizziness, fever chills /v/, chest pain Intermittent heart palpations; last episode yesterday; 1-5 minutes, multiple times 2. RADIATION: Does the pain go anywhere else? (e.g., into neck, jaw, arms, back)     denies 3. ONSET: When did the chest pain begin? (Minutes, hours or days)      month 4. PATTERN: Does the pain come and go, or has it been constant since it started?  Does it get worse with exertion?      constant 5. DURATION: How long does it last (e.g., seconds, minutes, hours)     ongoing 6. SEVERITY: How bad is the pain?  (e.g., Scale 1-10; mild, moderate, or severe)     8/10; diclofenac , deep blue; movement/ rest does not change pain level 7. CARDIAC RISK FACTORS: Do you have any history of heart problems or risk factors for heart disease? (e.g., angina, prior heart attack; diabetes, high blood pressure, high cholesterol, smoker, or strong family history of heart  disease)     Family hx, self htn 8. PULMONARY RISK FACTORS: Do you have any history of lung disease?  (e.g., blood clots in lung, asthma, emphysema, birth control pills)     no 9. CAUSE: What do you think is causing the chest pain?     palpations 10. OTHER SYMPTOMS: Do you have any other symptoms? (e.g., dizziness, nausea, vomiting, sweating, fever, difficulty breathing, cough) Denies diff breathing, chest pain, sweating, cough,dizziness  Protocols used: Chest Pain-A-AH

## 2024-01-23 ENCOUNTER — Encounter: Payer: Self-pay | Admitting: Family Medicine

## 2024-01-23 ENCOUNTER — Ambulatory Visit: Admitting: Family Medicine

## 2024-01-23 ENCOUNTER — Ambulatory Visit: Attending: Family Medicine

## 2024-01-23 VITALS — BP 136/80 | HR 85 | Temp 98.3°F | Ht 64.0 in | Wt 207.7 lb

## 2024-01-23 DIAGNOSIS — I2584 Coronary atherosclerosis due to calcified coronary lesion: Secondary | ICD-10-CM | POA: Diagnosis not present

## 2024-01-23 DIAGNOSIS — E039 Hypothyroidism, unspecified: Secondary | ICD-10-CM | POA: Diagnosis not present

## 2024-01-23 DIAGNOSIS — I2510 Atherosclerotic heart disease of native coronary artery without angina pectoris: Secondary | ICD-10-CM

## 2024-01-23 DIAGNOSIS — R0789 Other chest pain: Secondary | ICD-10-CM | POA: Diagnosis not present

## 2024-01-23 DIAGNOSIS — R7303 Prediabetes: Secondary | ICD-10-CM | POA: Diagnosis not present

## 2024-01-23 DIAGNOSIS — J439 Emphysema, unspecified: Secondary | ICD-10-CM | POA: Diagnosis not present

## 2024-01-23 DIAGNOSIS — R002 Palpitations: Secondary | ICD-10-CM

## 2024-01-23 MED ORDER — BUDESONIDE-FORMOTEROL FUMARATE 80-4.5 MCG/ACT IN AERO: 2.0000 | INHALATION_SPRAY | Freq: Two times a day (BID) | RESPIRATORY_TRACT | 5 refills | Status: AC

## 2024-01-23 MED ORDER — BLOOD GLUCOSE MONITORING SUPPL DEVI: 1.0000 | 0 refills | Status: AC

## 2024-01-23 MED ORDER — LANCET DEVICE MISC: 1.0000 | 0 refills | Status: AC

## 2024-01-23 MED ORDER — BLOOD GLUCOSE TEST VI STRP: 1.0000 | ORAL_STRIP | Freq: Every day | 0 refills | Status: AC

## 2024-01-23 MED ORDER — LANCETS MISC: 1.0000 | 0 refills | Status: AC

## 2024-01-23 NOTE — Progress Notes (Unsigned)
 EP to read.

## 2024-01-23 NOTE — Progress Notes (Unsigned)
 "  Established Patient Office Visit  Subjective   Patient ID: [name], female    DOB: July 29, 1965  Age: 59 y.o. MRN: [id number]  Chief Complaint  Patient presents with   Palpitations    X1 month    Palpitations   Discussed the use of AI scribe software for clinical note transcription with the patient, who gave verbal consent to proceed.  History of Present Illness   [name] is a 59 year old female who presents with palpitations and nausea.  She has intermittent palpitations for about one month, described as a fluttering sensation with associated nausea and dizziness. Episodes last 1 to 5 minutes, occur sporadically, and are significant enough that she must stop activity and rest until they resolve.  She has chronic nausea that worsened over time and uses Zofran  as needed. She is on semaglutide  10 mg, though nausea began before the dose increase. She also has occasional nonsevere chest tightness and pressure without dyspnea.  She reports neck pain radiating to her shoulder, which she relates to known cervical degenerative disc disease.  Her medications include semaglutide , thyroid  medication 50 mcg, blood pressure medication, and a newly started cholesterol medication with only two doses taken.  Family history is notable for significant heart disease, including maternal death from congestive heart failure, an uncle with multiple bypass surgeries, and a paternal grandfather who died before his fifties from heart problems.  She has recently changed her diet, is actively losing weight, and currently weighs 207 lb. She smokes and wants to quit. She has previously tried nicotine patches.        Current Outpatient Medications  Medication Instructions   albuterol  (PROVENTIL ) 2.5 mg, Nebulization, Every 6 hours PRN   albuterol  (VENTOLIN  HFA) 108 (90 Base) MCG/ACT inhaler 2 puffs, Inhalation, Every 4 hours PRN   Blood Glucose Monitoring Suppl DEVI 1 each, Does not apply,  As directed, Dispense based on patient and insurance preference. Use up to four times daily as directed. (FOR ICD-10 E10.9, E11.9).   budesonide -formoterol  (SYMBICORT ) 80-4.5 MCG/ACT inhaler 2 puffs, Inhalation, 2 times daily   Glucose Blood (BLOOD GLUCOSE TEST STRIPS) STRP 1 each, Does not apply, Daily, Dispense based on patient and insurance preference. Use up to four times daily as directed. (FOR ICD-10 E10.9, E11.9).   Lancet Device MISC 1 each, Does not apply, As directed, Dispense based on patient and insurance preference. Use up to four times daily as directed. (FOR ICD-10 E10.9, E11.9).   Lancets MISC 1 each, Does not apply, As directed, Dispense based on patient and insurance preference. Use up to four times daily as directed. (FOR ICD-10 E10.9, E11.9).   levothyroxine  (SYNTHROID ) 50 mcg, Oral, Daily   omeprazole  (PRILOSEC) 40 MG capsule TAKE 1 CAPSULE(40 MG) BY MOUTH DAILY   ondansetron  (ZOFRAN ) 4 mg, Oral, Every 8 hours PRN   rosuvastatin  (CRESTOR ) 10 mg, Oral, Daily   SEMAGLUTIDE  [place] 0.5 mg, Weekly   valsartan  (DIOVAN ) 160 mg, Oral, Daily    Patient Active Problem List   Diagnosis Date Noted   Depression, major, single episode, moderate (HCC) 03/05/2023   Headache with neurologic deficit 03/05/2023   Obstructive sleep apnea 03/04/2023   Prediabetes 12/03/2022   Primary hypertension 07/29/2022   Urge incontinence 07/29/2022   Abdominal pain, epigastric 05/25/2021   Nausea and vomiting 05/25/2021   Loose stools 05/25/2021   Menopausal symptoms 07/05/2020   Facet arthritis of cervical region 09/09/2019   Tubular adenoma of colon 06/16/2019   Urinary incontinence  04/09/2019   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      Objective:     BP 136/80   Pulse 85   Temp 98.3 F (36.8 C) (Oral)   Ht 5' 4 (1.626 m)   Wt 207 lb 11.2 oz (94.2 kg)   SpO2 96%   BMI 35.65 kg/m    Physical Exam Vitals reviewed.  Constitutional:      Appearance: Normal appearance. She is well-groomed. She is obese.  Cardiovascular:     Rate and Rhythm: Normal rate and regular rhythm.     Heart sounds: S1 normal and S2 normal.  Pulmonary:     Effort: Pulmonary effort is normal.     Breath sounds: Normal breath sounds and air entry.  Musculoskeletal:     Right lower leg: No edema.     Left lower leg: No edema.  Neurological:     Mental Status: She is alert and oriented to person, place, and time. Mental status is at baseline.     Gait: Gait is intact.  Psychiatric:        Mood and Affect: Mood and affect normal.        Speech: Speech normal.        Behavior: Behavior normal.        Judgment: Judgment normal.      No results found for any visits on 01/23/24.    The 10-year ASCVD risk score (Arnett DK, et al., 2019) is: 8.8%    Assessment & Plan:  Heart palpitations -     LONG TERM MONITOR (3-14 DAYS); Future  Prediabetes -     Hemoglobin A1c; Future -     Blood Glucose Monitoring Suppl; 1 each by Does not apply route as directed. Dispense based on patient and insurance preference. Use up to four times daily as directed. (FOR ICD-10 E10.9, E11.9).  Dispense: 1 each; Refill: 0 -     Blood Glucose Test; 1 each by Does not apply route daily. Dispense based on patient and insurance preference. Use up to four times daily as directed. (FOR ICD-10 E10.9, E11.9).  Dispense: 100 strip; Refill: 0 -     Lancet Device; 1 each by Does  not apply route as directed. Dispense based on patient and insurance preference. Use up to four times daily as directed. (FOR ICD-10 E10.9, E11.9).  Dispense: 1 each; Refill: 0 -     Lancets; 1 each by Does not apply route as directed. Dispense based on patient and insurance preference. Use up to four times daily as directed. (FOR ICD-10 E10.9, E11.9).  Dispense: 100 each; Refill: 0  Acquired hypothyroidism -     TSH; Future  Atypical chest pain -     Ambulatory referral to Cardiology  Coronary artery calcification of native artery -     Ambulatory referral to Cardiology  Emphysema lung (HCC) -     Budesonide -Formoterol  Fumarate; Inhale 2 puffs into the lungs in the morning and at bedtime.  Dispense: 1 each; Refill: 5   Assessment and Plan    Atherosclerotic heart disease of native coronary artery Advanced coronary calcification on CT scan, more than expected for age. No current chest pain, but occasional tightness and pressure. Family history of heart disease. Smoking contributes to calcifications. Cholesterol medication initiated to reduce risk of heart attack and stroke. Potential for Medicaid coverage of [place]  if coronary artery disease is confirmed. - Referred to cardiologist for CT angiogram to assess for blockages. - Continue cholesterol medication. - Discussed potential Medicaid coverage for [place]gov[place]  if coronary artery disease is confirmed.  Palpitations and atypical chest pain Intermittent palpitations and atypical chest pain, described as tightness and pressure, not severe. Episodes last 1-5 minutes, relieved by rest. Differential includes hypoglycemia due to semaglutide  use, thyroid  dysfunction, or cardiac arrhythmia. No current palpitations during visit. Holter monitor ordered to capture episodes. Blood sugar monitoring during episodes to assess for hypoglycemia. - Ordered Holter monitor for 14 days to capture palpitations. - Provided blood sugar machine for monitoring during  episodes. - Rechecked thyroid  function tests. - Monitor blood sugar during episodes to assess for hypoglycemia.  Acquired hypothyroidism Previously diagnosed with hypothyroidism, currently on 50 mcg of thyroid  medication. Recent labs from [facility] showed slight improvement. Re-evaluation needed to assess current thyroid  function and adjust medication if necessary. - Rechecked thyroid  function tests to assess current status and adjust medication if necessary.  Nicotine dependence, cigarettes Desire to quit smoking. Previous use of nicotine patches. Discussed various cessation aids including free patches via 800 number, nicotine lozenges, gum, and vaping. Advised against vaping as a long-term solution. Smoking contributes to coronary calcifications. - Provided prescription for nicotine patches. - Discussed free nicotine patches via 800 number. - Discussed nicotine lozenges and gum as cessation aids.        No follow-ups on file.    Heron CHRISTELLA Sharper, MD "

## 2024-01-27 ENCOUNTER — Other Ambulatory Visit

## 2024-01-27 DIAGNOSIS — R7303 Prediabetes: Secondary | ICD-10-CM | POA: Diagnosis not present

## 2024-01-27 DIAGNOSIS — E039 Hypothyroidism, unspecified: Secondary | ICD-10-CM | POA: Diagnosis not present

## 2024-01-27 LAB — HEMOGLOBIN A1C: Hgb A1c MFr Bld: 6 % (ref 4.6–6.5)

## 2024-01-27 LAB — TSH: TSH: 2.43 u[IU]/mL (ref 0.35–5.50)

## 2024-01-30 ENCOUNTER — Ambulatory Visit: Payer: Self-pay | Admitting: Family Medicine

## 2024-02-16 ENCOUNTER — Ambulatory Visit: Admitting: Cardiovascular Disease

## 2024-02-16 ENCOUNTER — Encounter: Payer: Self-pay | Admitting: Cardiovascular Disease

## 2024-02-16 ENCOUNTER — Ambulatory Visit (HOSPITAL_BASED_OUTPATIENT_CLINIC_OR_DEPARTMENT_OTHER): Admitting: Cardiology

## 2024-02-20 ENCOUNTER — Ambulatory Visit: Admitting: Internal Medicine

## 2024-02-20 ENCOUNTER — Encounter: Payer: Self-pay | Admitting: Internal Medicine

## 2024-02-20 ENCOUNTER — Other Ambulatory Visit (HOSPITAL_COMMUNITY): Payer: Self-pay

## 2024-02-20 VITALS — BP 110/68 | Ht 64.0 in | Wt 208.0 lb

## 2024-02-20 DIAGNOSIS — I2510 Atherosclerotic heart disease of native coronary artery without angina pectoris: Secondary | ICD-10-CM

## 2024-02-20 DIAGNOSIS — Z720 Tobacco use: Secondary | ICD-10-CM

## 2024-02-20 DIAGNOSIS — R002 Palpitations: Secondary | ICD-10-CM

## 2024-02-20 DIAGNOSIS — Z716 Tobacco abuse counseling: Secondary | ICD-10-CM

## 2024-02-20 DIAGNOSIS — I10 Essential (primary) hypertension: Secondary | ICD-10-CM

## 2024-02-20 LAB — LIPOPROTEIN A (LPA)

## 2024-02-20 LAB — LIPID PANEL
Chol/HDL Ratio: 3.4 ratio (ref 0.0–4.4)
Cholesterol, Total: 141 mg/dL (ref 100–199)
HDL: 42 mg/dL
LDL Chol Calc (NIH): 78 mg/dL (ref 0–99)
Triglycerides: 119 mg/dL (ref 0–149)
VLDL Cholesterol Cal: 21 mg/dL (ref 5–40)

## 2024-02-20 MED ORDER — ROSUVASTATIN CALCIUM 40 MG PO TABS: 40.0000 mg | ORAL_TABLET | Freq: Every day | ORAL | 3 refills | Status: AC

## 2024-02-20 MED ORDER — NITROGLYCERIN 0.4 MG SL SUBL: 0.4000 mg | SUBLINGUAL_TABLET | SUBLINGUAL | 3 refills | Status: AC | PRN

## 2024-02-20 MED ORDER — METOPROLOL TARTRATE 50 MG PO TABS
50.0000 mg | ORAL_TABLET | Freq: Once | ORAL | 0 refills | Status: AC
  Filled 2024-02-20: qty 1, 1d supply, fill #0

## 2024-02-20 NOTE — Progress Notes (Signed)
 " Cardiology Office Note:  .   Date:  02/20/2024  ID:  [name], DOB 1965/06/25, MRN [id number] PCP: Ozell Heron HERO, MD   [facility] Providers Cardiologist:  Emeline FORBES Calender, DO    History of Present Illness: .     Discussed the use of AI scribe software for clinical note transcription with the patient, who gave verbal consent to proceed.  History of Present Illness [name] is a 59 year old female with hypertension and coronary artery disease who presents with heart palpitations and shortness of breath. She was referred by her PCP for evaluation of coronary calcifications.  Cardiac palpitations and associated symptoms - Heart palpitations ongoing for at least one year, with increasing frequency and intensity - Episodes occur up to twice daily prior to Zio patch monitoring; four episodes during 14-day Zio patch period - Associated symptoms during episodes include extreme nausea, lightheadedness, dizziness, and sensation of impending syncope without actual loss of consciousness - Symptoms of nausea, lightheadedness, and dizziness sometimes occur independently of palpitations  Dyspnea and exercise intolerance - Chronic shortness of breath attributed to emphysema, sometimes coinciding with palpitations - Labored breathing and tachycardia with ambulation - No chest pain during exertion  Chest discomfort - Occasional chest tightness or pressure described as 'spells', occurring once every 4-6 months - Nocturnal or exertional chest pain denied - Episodes of chest tightness have prompted suggestion for emergency care, which she has declined  Coronary artery disease and coronary calcification - History of coronary artery disease and advanced coronary artery calcification - CT chest (12/18/2023): advanced coronary artery calcification at bifurcation of left main coronary artery and aortic atherosclerosis - Echocardiogram (09/07/2021): ejection fraction  55-60%  Hypertension - History of hypertension - Previously on lisinopril and HCTZ; currently on valsartan  80 mg daily and HCTZ 25 mg daily with potassium  Dyslipidemia - Lipid panel (08/22/2023): total cholesterol 229 mg/dL, LDL 851 mg/dL, HDL 55 mg/dL, triglycerides 871 mg/dL  Tobacco use - Smokes a few cigarettes daily, primarily during commute and work hours - No smoking at home or on weekends - Smoking since age 76  Weight management and metabolic health - Participating in weight loss program with Paris Harvest MD - Receiving semaglutide  injections and hormone replacement therapy - Weight reduced from 225 lbs to 208 lbs - Follows gluten-free diet, avoids fast food, and limits salt intake - HbA1c 6.0% (01/27/2024)  Thyroid  function - History of hypothyroidism - TSH within normal limits        ROS: Remaining review of systems negative  Studies Reviewed: SABRA   EKG Interpretation Date/Time:  Friday February 20 2024 08:49:32 EST Ventricular Rate:  95 PR Interval:  148 QRS Duration:  68 QT Interval:  344 QTC Calculation: 432 R Axis:   27  Text Interpretation: Normal sinus rhythm Low voltage QRS Otherwise normal ECG When compared with ECG of 14-Apr-2022 17:13, No significant change since Confirmed by Calender Emeline [phone]) on 02/20/2024 8:53:24 AM    Results Labs TSH (01/23/2024): Within normal limits Hemoglobin A1c (01/27/2024): 6.0 Lipid panel (08/22/2023): Total cholesterol 229, LDL 148, HDL 55, triglycerides 128  Radiology Chest CT (12/18/2023): Coronary artery calcification at the bifurcation of the left main coronary artery and aortic atherosclerosis  Diagnostic Echocardiogram (09/07/2021): Ejection fraction 55-60%, no regional wall motion abnormalities, no hemodynamically significant valvular abnormalities EKG (02/20/2024): Normal (Independently interpreted) Risk Assessment/Calculations:             Physical Exam:   VS:  BP 110/68  Ht 5' 4 (1.626 m)   Wt 208 lb  (94.3 kg)   SpO2 92%   BMI 35.70 kg/m    Wt Readings from Last 3 Encounters:  02/20/24 208 lb (94.3 kg)  01/23/24 207 lb 11.2 oz (94.2 kg)  12/10/23 213 lb 3.2 oz (96.7 kg)    GEN: Well nourished, well developed in no acute distress NECK: No JVD; No carotid bruits CARDIAC:  RRR, no murmurs, no rubs, no gallops RESPIRATORY:  Clear to auscultation without rales, wheezing or rhonchi  ABDOMEN: Soft, non-tender, non-distended EXTREMITIES:  No edema; No deformity   ASSESSMENT AND PLAN: .    Assessment and Plan Assessment & Plan Evaluation of coronary artery disease with coronary artery calcification and aortic atherosclerosis Coronary artery calcification at left main coronary artery bifurcation and aortic atherosclerosis on CT. cardiac risk factors include hypertension hyperlipidemia and tobacco use - Ordered coronary CTA with metoprolol  prior to the study - Started aspirin 81 mg daily. - Increased Crestor  to 40 mg daily. - Ordered echocardiogram. - As needed nitroglycerin  for chest pain - Advised to contact 911 if chest pain occurs/recurs - Discussed potential need for heart catheterization if CT results are abnormal.  Palpitations Intermittent palpitations with nausea, lightheadedness, and dizziness. Symptoms increasing in frequency and severity. Awaiting Zio patch results. EKGs normal but limited. - Await results of Zio patch monitoring ordered by PCP. - Ordered echocardiogram.  Primary hypertension Hypertension managed with valsartan  and HCTZ. Blood pressure control crucial due to coronary artery disease and aortic atherosclerosis. - Continue valsartan  80 mg daily. - Continue HCTZ 25 mg daily.  Hyperlipidemia Elevated total cholesterol and LDL. Increasing statin dosage to manage lipid levels and reduce cardiovascular risk. - Increased Crestor  to 40 mg daily. - Lipid panel and LP(a)  Nicotine dependence, cigarettes Long-standing nicotine dependence. Smoking cessation  critical due to exacerbating effect on coronary artery disease and cardiovascular risk. - Advised smoking cessation. - Discussed risks of continued smoking, including worsening of coronary artery disease and potential myocardial infarction.            Follow up: 4 to 6 weeks  Signed, Emeline FORBES Calender, DO  02/20/2024 10:27 AM    [facility] "

## 2024-02-20 NOTE — Patient Instructions (Addendum)
 Medication Instructions:  Start Aspirin 81 mg take one tablet daily  Increase Rosuvastatin  40 mg take one tablet daily *If you need a refill on your cardiac medications before your next appointment, please call your pharmacy*   Labs Today Lpa, Lipids    Testing/Procedures: Your physician has requested that you have an echocardiogram. Echocardiography is a painless test that uses sound waves to create images of your heart. It provides your doctor with information about the size and shape of your heart and how well your hearts chambers and valves are working. This procedure takes approximately one hour. There are no restrictions for this procedure. Please do NOT wear cologne, perfume, aftershave, or lotions (deodorant is allowed). Please arrive 15 minutes prior to your appointment time.  Please note: We ask at that you not bring children with you during ultrasound (echo/ vascular) testing. Due to room size and safety concerns, children are not allowed in the ultrasound rooms during exams. Our front office staff cannot provide observation of children in our lobby area while testing is being conducted. An adult accompanying a patient to their appointment will only be allowed in the ultrasound room at the discretion of the ultrasound technician under special circumstances. We apologize for any inconvenience.        Your cardiac CT will be scheduled at one of the below locations:    [facility] [address]  [place], [place] [address]   If scheduled at the [facility] at [address], please enter the parking lot using the [address] entrance and use the FREE valet service at the patient drop-off area. Enter the building and check-in with registration on the main floor.   Please follow these instructions carefully (unless otherwise directed):  An IV will be required for this test and Nitroglycerin  will be given.  Hold all erectile  dysfunction medications at least 3 days (72 hrs) prior to test. (Ie viagra, cialis, sildenafil, tadalafil, etc)   On the Night Before the Test: Be sure to Drink plenty of water. Do not consume any caffeinated/decaffeinated beverages or chocolate 12 hours prior to your test. Do not take any antihistamines 12 hours prior to your test.  If the patient has contrast allergy: Patient will need a prescription for Prednisone  and very clear instructions (as follows): Prednisone  50 mg - take 13 hours prior to test Take another Prednisone  50 mg 7 hours prior to test Take another Prednisone  50 mg 1 hour prior to test Take Benadryl 50 mg 1 hour prior to test Patient must complete all four doses of above prophylactic medications. Patient will need a ride after test due to Benadryl.  On the Day of the Test: Drink plenty of water until 1 hour prior to the test. Do not eat any food 1 hour prior to test. You may take your regular medications prior to the test.  Take metoprolol  (Lopressor ) two hours prior to test. If you take Furosemide/Hydrochlorothiazide /Spironolactone/Chlorthalidone, please HOLD on the morning of the test. Patients who wear a continuous glucose monitor MUST remove the device prior to scanning. FEMALES- please wear underwire-free bra if available, avoid dresses & tight clothing  *      After the Test: Drink plenty of water. After receiving IV contrast, you may experience a mild flushed feeling. This is normal. On occasion, you may experience a mild rash up to 24 hours after the test. This is not dangerous. If this occurs, you can take Benadryl 25 mg,  Zyrtec, Claritin, or Allegra and increase your fluid intake. (Patients taking Tikosyn should avoid Benadryl, and may take Zyrtec, Claritin, or Allegra) If you experience trouble breathing, this can be serious. If it is severe call 911 IMMEDIATELY. If it is mild, please call our office.  We will call to schedule your test 2-4 weeks out  understanding that some insurance companies will need an authorization prior to the service being performed.   For more information and frequently asked questions, please visit our website : [email or web address]  For non-scheduling related questions, please contact the cardiac imaging nurse navigator should you have any questions/concerns: Cardiac Imaging Nurse Navigators Direct Office Dial: [phone]   For scheduling needs, including cancellations and rescheduling, please call Brittany, [phone].  For billing questions, please call [phone].    Follow-Up: At [facility], you and your health needs are our priority.  As part of our continuing mission to provide you with exceptional heart care, our providers are all part of one team.  This team includes your primary Cardiologist (physician) and Advanced Practice Providers or APPs (Physician Assistants and Nurse Practitioners) who all work together to provide you with the care you need, when you need it.  Your next appointment:   4-6 week(s)  Provider:   Emeline FORBES Calender, DO

## 2024-03-29 ENCOUNTER — Ambulatory Visit (HOSPITAL_COMMUNITY)

## 2024-04-05 ENCOUNTER — Ambulatory Visit: Admitting: Internal Medicine

## 2024-06-09 ENCOUNTER — Ambulatory Visit: Admitting: Family Medicine
